# Patient Record
Sex: Male | Born: 1955 | Race: White | Hispanic: No | Marital: Married | Smoking: Former smoker
Health system: Southern US, Community
[De-identification: ages and names within clinical notes are randomized; demographics above are authoritative.]

## PROBLEM LIST (undated history)

## (undated) DIAGNOSIS — T7840XA Allergy, unspecified, initial encounter: Secondary | ICD-10-CM

## (undated) DIAGNOSIS — K219 Gastro-esophageal reflux disease without esophagitis: Secondary | ICD-10-CM

## (undated) DIAGNOSIS — E785 Hyperlipidemia, unspecified: Secondary | ICD-10-CM

## (undated) DIAGNOSIS — M1990 Unspecified osteoarthritis, unspecified site: Secondary | ICD-10-CM

## (undated) DIAGNOSIS — J45909 Unspecified asthma, uncomplicated: Secondary | ICD-10-CM

## (undated) HISTORY — DX: Allergy, unspecified, initial encounter: T78.40XA

## (undated) HISTORY — DX: Unspecified asthma, uncomplicated: J45.909

## (undated) HISTORY — DX: Unspecified osteoarthritis, unspecified site: M19.90

## (undated) HISTORY — DX: Hyperlipidemia, unspecified: E78.5

## (undated) HISTORY — DX: Gastro-esophageal reflux disease without esophagitis: K21.9

## (undated) HISTORY — PX: KNEE SURGERY: SHX244

---

## 2018-02-21 LAB — COLOGUARD: Cologuard: NEGATIVE

## 2018-04-18 ENCOUNTER — Ambulatory Visit (INDEPENDENT_AMBULATORY_CARE_PROVIDER_SITE_OTHER): Payer: BLUE CROSS/BLUE SHIELD

## 2018-04-18 ENCOUNTER — Encounter: Payer: Self-pay | Admitting: Family Medicine

## 2018-04-18 ENCOUNTER — Ambulatory Visit (INDEPENDENT_AMBULATORY_CARE_PROVIDER_SITE_OTHER): Payer: BLUE CROSS/BLUE SHIELD | Admitting: Family Medicine

## 2018-04-18 VITALS — BP 118/78 | HR 60 | Resp 17 | Ht 70.0 in | Wt 209.7 lb

## 2018-04-18 DIAGNOSIS — M1711 Unilateral primary osteoarthritis, right knee: Secondary | ICD-10-CM

## 2018-04-18 DIAGNOSIS — M25561 Pain in right knee: Secondary | ICD-10-CM | POA: Diagnosis not present

## 2018-04-18 DIAGNOSIS — Z7689 Persons encountering health services in other specified circumstances: Secondary | ICD-10-CM

## 2018-04-18 NOTE — Progress Notes (Signed)
Edwin Murray, is a 63 y.o. male  GUR:[id number]  EGB:[id number]  DOB - 11/19/1955  CC:  Chief Complaint  Patient presents with  . Establish Care  . Knee Pain       HPI: Edwin Murray is a 63 y.o. male is here today to establish care and evaluation of chronic right knee pain.  Knee Pain: Patient presents with knee pain involving the  right knee. Onset of the symptoms was 1.5 years. Right knee pain started after an injury at age 56 in which he suffered a cartilage tear which resulted in knee surgery. In 2013, he developed pain of right knee again and was told at that time he had mild osteoarthritis and imaging was significant for mild bone-on-bone changes.  He complains currently of 1/2 years of ability to extend his knee without experiencing a sharp pain.  Given level of pain at this point he is unable to completely extend his right knee/leg. This is causing him currently to walk with a limp and is also noticeable to others. Also has a problem with flexion of the right knee which causes a dull throbbing type ache.  This problem has been ongoing for a year and a half however has not worsened or changed within that timeframe. He has attempted relief as needed with ibuprofen. When he is at rest he experiences no pain. He has intermittent swelling of the knee. He denies any erythema or increased warmth of the knee. He recently relocated to [facility] from [place] therefore that has not seen any providers in our area although notes that he has all of his previous records at home.  He denies any recent falls or injuries.  Current medications: Current Outpatient Medications:  .  aspirin 81 MG chewable tablet, Chew 81 mg by mouth daily., Disp: , Rfl:  .  omeprazole (PRILOSEC OTC) 20 MG tablet, Take 20 mg by mouth daily., Disp: , Rfl:  .  rosuvastatin (CRESTOR) 10 MG tablet, Take 10 mg by mouth daily., Disp: , Rfl:    Pertinent family medical history: family history is not on file.    Social  History   Socioeconomic History  . Marital status: Married    Spouse name: Not on file  . Number of children: Not on file  . Years of education: Not on file  . Highest education level: Not on file  Occupational History  . Not on file  Social Needs  . Financial resource strain: Not on file  . Food insecurity:    Worry: Not on file    Inability: Not on file  . Transportation needs:    Medical: Not on file    Non-medical: Not on file  Tobacco Use  . Smoking status: Former Games developer  . Smokeless tobacco: Never Used  Substance and Sexual Activity  . Alcohol use: Not Currently  . Drug use: Never  . Sexual activity: Yes    Partners: Female    Birth control/protection: Post-menopausal  Lifestyle  . Physical activity:    Days per week: Not on file    Minutes per session: Not on file  . Stress: Not on file  Relationships  . Social connections:    Talks on phone: Not on file    Gets together: Not on file    Attends religious service: Not on file    Active member of club or organization: Not on file    Attends meetings of clubs or organizations: Not on file    Relationship status: Not on  file  . Intimate partner violence:    Fear of current or ex partner: Not on file    Emotionally abused: Not on file    Physically abused: Not on file    Forced sexual activity: Not on file  Other Topics Concern  . Not on file  Social History Narrative  . Not on file    Review of Systems: Pertinent negatives listed in HPI  Objective:   Vitals:   04/18/18 0918  BP: 118/78  Pulse: 60  Resp: 17  SpO2: 96%    BP Readings from Last 3 Encounters:  04/18/18 118/78    Filed Weights   04/18/18 0918  Weight: 209 lb 10.5 oz (95.1 kg)      Physical Exam  Constitutional: He is oriented to person, place, and time.  HENT:  Head: Normocephalic and atraumatic.  Neck: Normal range of motion. Neck supple.  Cardiovascular: Normal rate, regular rhythm and normal heart sounds.   Pulmonary/Chest: Effort normal and breath sounds normal.  Musculoskeletal:     Right knee: He exhibits decreased range of motion and swelling. He exhibits no deformity and no erythema.  Neurological: He is alert and oriented to person, place, and time.  Psychiatric: Mood, memory, affect and judgment normal.       Assessment and plan:  1. Encounter to establish care 2. Primary osteoarthritis of right knee - DG Knee Complete 4 Views Right; Future  - Ambulatory referral to Orthopedic Surgery -Continue Ibuprofen as needed.   A total of 25 minutes spent, greater than 50 % of this time was spent obtaining verbal history related to chief complaint, discussing options for treatment, imaging and coordination of care.   Return in about 6 months (around 10/19/2018) for cholesterol and fasting labs .   The patient was given clear instructions to go to ER or return to medical center if symptoms don't improve, worsen or new problems develop. The patient verbalized understanding. The patient was advised  to call and obtain lab results if they haven't heard anything from out office within 7-10 business days.  Edwin Courts, FNP Primary Care at [facility] [address], [place] [address] [phone]: [phone]    This note has been created with Dragon speech recognition software and Paediatric nurse. Any transcriptional errors are unintentional.

## 2018-04-18 NOTE — Patient Instructions (Addendum)
Thank you for choosing Primary Care at [facility] to be your medical home!    Edwin Murray was seen by Joaquin Courts, FNP today.   Edwin Murray's primary care provider is Bing Neighbors, FNP.   For the best care possible, you should try to see Joaquin Courts, FNP-C whenever you come to the clinic.   We look forward to seeing you again soon!  If you have any questions about your visit today, please call us at [phone] or feel free to reach your primary care provider via MyChart.       Arthritis Arthritis means joint pain. It can also mean joint disease. A joint is a place where bones come together. People who have arthritis may have:  Red joints.  Swollen joints.  Stiff joints.      Warm joints.  A fever.  A feeling of being sick. Follow these instructions at home: Pay attention to any changes in your symptoms. Take these actions to help with your pain and swelling. Medicines  Take over-the-counter and prescription medicines only as told by your doctor.  Do not take aspirin for pain if your doctor says that you may have gout. Activity  Rest your joint if your doctor tells you to.  Avoid activities that make the pain worse.  Exercise your joint regularly as told by your doctor. Try doing exercises like: ? Swimming. ? Water aerobics. ? Biking. ? Walking. Joint Care   If your joint is swollen, keep it raised (elevated) if told by your doctor.  If your joint feels stiff in the morning, try taking a warm shower.  If you have diabetes, do not apply heat without asking your doctor.  If told, apply heat to the joint: ? Put a towel between the joint and the hot pack or heating pad. ? Leave the heat on the area for 20-30 minutes.  If told, apply ice to the joint: ? Put ice in a plastic bag. ? Place a towel between your skin and the bag. ? Leave the ice on for 20 minutes, 2-3 times per day.  Keep all follow-up visits as told by your  doctor. Contact a doctor if:  The pain gets worse.  You have a fever. Get help right away if:  You have very bad pain in your joint.  You have swelling in your joint.  Your joint is red.  Many joints become painful and swollen.  You have very bad back pain.  Your leg is very weak.  You cannot control your pee (urine) or poop (stool). This information is not intended to replace advice given to you by your health care provider. Make sure you discuss any questions you have with your health care provider. Document Released: 04/29/2009 Document Revised: 07/11/2015 Document Reviewed: 04/30/2014 Elsevier Interactive Patient Education  2019 [employer].

## 2018-04-19 ENCOUNTER — Telehealth: Payer: Self-pay | Admitting: Family Medicine

## 2018-04-19 NOTE — Telephone Encounter (Signed)
Patient notified of xray results & recommendations. Expressed understanding. 

## 2018-04-19 NOTE — Telephone Encounter (Signed)
Notify patient recent knee imagine confirms mild to moderate arthritis with small amount of fluid present on the knee. He has been referred to [place] [facility] and will be contacted by their office to schedule an appointment

## 2018-04-29 ENCOUNTER — Telehealth (INDEPENDENT_AMBULATORY_CARE_PROVIDER_SITE_OTHER): Payer: Self-pay | Admitting: Orthopaedic Surgery

## 2018-04-29 ENCOUNTER — Encounter (INDEPENDENT_AMBULATORY_CARE_PROVIDER_SITE_OTHER): Payer: Self-pay | Admitting: Orthopaedic Surgery

## 2018-04-29 ENCOUNTER — Other Ambulatory Visit: Payer: Self-pay

## 2018-04-29 ENCOUNTER — Ambulatory Visit (INDEPENDENT_AMBULATORY_CARE_PROVIDER_SITE_OTHER): Payer: BLUE CROSS/BLUE SHIELD | Admitting: Orthopaedic Surgery

## 2018-04-29 VITALS — BP 132/83 | HR 59 | Ht 70.0 in | Wt 200.0 lb

## 2018-04-29 DIAGNOSIS — M1711 Unilateral primary osteoarthritis, right knee: Secondary | ICD-10-CM

## 2018-04-29 MED ORDER — METHYLPREDNISOLONE ACETATE 40 MG/ML IJ SUSP
80.0000 mg | INTRAMUSCULAR | Status: AC | PRN
  Administered 2018-04-29: 80 mg via INTRA_ARTICULAR

## 2018-04-29 MED ORDER — BUPIVACAINE HCL 0.5 % IJ SOLN
2.0000 mL | INTRAMUSCULAR | Status: AC | PRN
  Administered 2018-04-29: 2 mL via INTRA_ARTICULAR

## 2018-04-29 MED ORDER — LIDOCAINE HCL 1 % IJ SOLN
2.0000 mL | INTRAMUSCULAR | Status: AC | PRN
  Administered 2018-04-29: 2 mL

## 2018-04-29 NOTE — Progress Notes (Signed)
Office Visit Note   Patient: Edwin Murray           Date of Birth: November 19, 1955           MRN: [id number] Visit Date: 04/29/2018              Requested by: Bing Neighbors, FNP [address] [place], [place] [address] PCP: Bing Neighbors, FNP   Assessment & Plan: Visit Diagnoses:  1. Unilateral primary osteoarthritis, right knee     Plan: New arthritis right knee.  Long discussion over 45 minutes regarding the diagnosis and treatment options.  We will try her cortisone injection and pre-CERT Visco supplementation.  Have discussed exercises and anti-inflammatory medicines.  Follow-Up Instructions: Return if symptoms worsen or fail to improve.   Orders:  Orders Placed This Encounter  Procedures  . Large Joint Inj: R knee   No orders of the defined types were placed in this encounter.     Procedures: Large Joint Inj: R knee on 04/29/2018 10:03 AM Indications: pain and diagnostic evaluation Details: 25 G 1.5 in needle, anteromedial approach  Arthrogram: No  Medications: 2 mL lidocaine 1 %; 2 mL bupivacaine 0.5 %; 80 mg methylPREDNISolone acetate 40 MG/ML Procedure, treatment alternatives, risks and benefits explained, specific risks discussed. Consent was given by the patient. Immediately prior to procedure a time out was called to verify the correct patient, procedure, equipment, support staff and site/side marked as required. Patient was prepped and draped in the usual sterile fashion.       Clinical Data: No additional findings.   Subjective: Chief Complaint  Patient presents with  . Right Knee - Pain  Patient presents today for his right knee. He said that he cannot fully extend or flex his knee without pain for the past year. He had arthroscopic surgery in 2013 and was told he was bone on bone then. He walks with a limp. He does not take anything for pain. He saw his PCP and had x-rays taken on 04/18/2018 Edwin Murray is 63 years old accompanied  by his wife.  He has a long history of problems referable to his right knee dating back to when he was 63 years old.  He injured his knee and underwent an open medial meniscectomy.  He was on crutches and hospitalized about 6 days.  He was told that over time he might develop arthritis.  2013 he had recurrence of the right knee pain.  While living in [place] he had an MRI scan demonstrating some degenerative changes.  He underwent a knee arthroscopy for "cleanout".  He was told that he had a fair amount of arthritis.  Over the past 5 to 6 years she has had increasing pain to the point where he has had some popping clicking effusions.  Wear a pullover knee support on occasion.  He is found that his knee will not completely extend.  He is unsure the diagnosis and the issue with his knee.  He has had recent films that I reviewed on the PACS system.  There is considerable degenerative change in the medial compartment.  Alignment looks fine but there is irregularity of the joint surfaces and peripheral osteophytes there is subchondral sclerosis and small subchondral cyst more medial than lateral.  Might even have some small loose bodies or even posteriorly.  There are patellofemoral as well.  Films are consistent with osteoarthritis predominately in the medial compartment  HPI  Review of Systems   Objective:  Vital Signs: BP 132/83   Pulse (!) 59   Ht 5\' 10"  (1.778 m)   Wt 200 lb (90.7 kg)   BMI 28.70 kg/m   Physical Exam Constitutional:      Appearance: He is well-developed.  Eyes:     Pupils: Pupils are equal, round, and reactive to light.  Pulmonary:     Effort: Pulmonary effort is normal.  Skin:    General: Skin is warm and dry.  Neurological:     Mental Status: He is alert and oriented to person, place, and time.  Psychiatric:        Behavior: Behavior normal.     Ortho Exam wake alert and oriented x3.  Comfortable sitting.  Possibly very small effusion of his right knee.  He  lacked about 12 degrees to full extension and flexed about 110 degrees.  No instability.  Well-healed medial joint incision from a prior surgery.  Has some patellar crepitation with popping and clicking.  Mild medial joint pain.  No pain laterally.  No calf pain.  No popliteal mass.  No swelling distally straight leg raise negative.  Painless range of motion of the hip.  Specialty Comments:  No specialty comments available.  Imaging: No results found.   PMFS History: Patient Active Problem List   Diagnosis Date Noted  . Unilateral primary osteoarthritis, right knee 04/29/2018   Past Medical History:  Diagnosis Date  . GERD (gastroesophageal reflux disease)   . Hyperlipidemia     Family History  Problem Relation Age of Onset  . Meniere's disease Sister   . High Cholesterol Sister   . High Cholesterol Sister     Past Surgical History:  Procedure Laterality Date  . KNEE SURGERY Left    x2  . KNEE SURGERY Right    x2   Social History   Occupational History  . Not on file  Tobacco Use  . Smoking status: Former Games developermoker  . Smokeless tobacco: Never Used  Substance and Sexual Activity  . Alcohol use: Not Currently  . Drug use: Never  . Sexual activity: Yes    Partners: Female    Birth control/protection: Post-menopausal

## 2018-04-29 NOTE — Telephone Encounter (Signed)
Please get authorization for right knee visco injection. This is Dr.Whitfield's patient. Thanks!

## 2018-04-29 NOTE — Telephone Encounter (Signed)
[employer], needs to submit for Synvisc series right knee.

## 2018-05-04 ENCOUNTER — Telehealth (INDEPENDENT_AMBULATORY_CARE_PROVIDER_SITE_OTHER): Payer: Self-pay

## 2018-05-04 NOTE — Telephone Encounter (Signed)
Noted  

## 2018-05-04 NOTE — Telephone Encounter (Signed)
Submitted VOB for Synvisc series, right knee. 

## 2018-05-06 ENCOUNTER — Telehealth (INDEPENDENT_AMBULATORY_CARE_PROVIDER_SITE_OTHER): Payer: Self-pay

## 2018-05-06 NOTE — Telephone Encounter (Signed)
Patient's insurance, [facility] does not cover gel injections.  Please advise on what the next step would be.  Thank You.

## 2018-05-06 NOTE — Telephone Encounter (Signed)
Please advise 

## 2018-05-06 NOTE — Telephone Encounter (Signed)
If no relief with the cortisone and anti inflammatory meds then he needs to consider a knee replacement

## 2018-05-06 NOTE — Telephone Encounter (Signed)
Called and spoke with patient. I made him aware that insurance would not cover visco. He said that the cortisone injection has helped and may continue to get those in the future if needed.

## 2018-08-04 ENCOUNTER — Telehealth: Payer: Self-pay | Admitting: Family Medicine

## 2018-08-04 MED ORDER — ROSUVASTATIN CALCIUM 10 MG PO TABS: 10.0000 mg | ORAL_TABLET | Freq: Every day | ORAL | 0 refills | Status: DC

## 2018-08-04 NOTE — Telephone Encounter (Signed)
Rx sent 

## 2018-08-04 NOTE — Telephone Encounter (Signed)
Caller Name: Ashan Cueva   Reason for Call: medication refill for rosuvastatin (CRESTOR) 10 MG tablet [[id number]]   Patient stated for insurance purposes he needs a 90 day supply.    If this is a medication request: confirm pharmacy [id number], [place] call back number: [phone]    Action taken by recipient of request:

## 2018-08-11 ENCOUNTER — Other Ambulatory Visit: Payer: Self-pay | Admitting: Family Medicine

## 2018-10-18 ENCOUNTER — Telehealth: Payer: Self-pay

## 2018-10-18 NOTE — Telephone Encounter (Signed)
Called patient to do their pre-visit COVID screening.  Call went to voicemail. Unable to do prescreening.  

## 2018-10-18 NOTE — Telephone Encounter (Signed)
Patient returned call for previsit screening questions.  Have you tested positive for COVID or are you currently waiting for COVID test results? no  Have you recently traveled internationally([place], [place], [place], [place], [place]) or within the [place] to a hotspot area([place], [place], [place], [place], [place])? no  Are you currently experiencing any of the following symptoms: fever, cough, SHOB, fatigue, body aches, loss of smell/taste, rash, diarrhea, vomiting, severe headaches, weakness, sore throat? no  Have you been in contact with anyone who has recently travelled? no  Have you been in contact with anyone who is experiencing any of the above symptoms or been diagnosed with COVID  or works in or has recently visited a SNF? no

## 2018-10-19 ENCOUNTER — Other Ambulatory Visit: Payer: Self-pay

## 2018-10-19 ENCOUNTER — Ambulatory Visit (INDEPENDENT_AMBULATORY_CARE_PROVIDER_SITE_OTHER): Payer: BC Managed Care – PPO | Admitting: Nurse Practitioner

## 2018-10-19 VITALS — BP 131/86 | HR 54 | Temp 97.3°F | Resp 17 | Wt 204.4 lb

## 2018-10-19 DIAGNOSIS — Z1159 Encounter for screening for other viral diseases: Secondary | ICD-10-CM

## 2018-10-19 DIAGNOSIS — Z114 Encounter for screening for human immunodeficiency virus [HIV]: Secondary | ICD-10-CM | POA: Diagnosis not present

## 2018-10-19 DIAGNOSIS — Z833 Family history of diabetes mellitus: Secondary | ICD-10-CM

## 2018-10-19 DIAGNOSIS — Z1211 Encounter for screening for malignant neoplasm of colon: Secondary | ICD-10-CM

## 2018-10-19 DIAGNOSIS — Z131 Encounter for screening for diabetes mellitus: Secondary | ICD-10-CM

## 2018-10-19 DIAGNOSIS — E785 Hyperlipidemia, unspecified: Secondary | ICD-10-CM | POA: Diagnosis not present

## 2018-10-19 DIAGNOSIS — L2082 Flexural eczema: Secondary | ICD-10-CM

## 2018-10-19 DIAGNOSIS — R319 Hematuria, unspecified: Secondary | ICD-10-CM

## 2018-10-19 DIAGNOSIS — Z130 Encounter for screening for diseases of the blood and blood-forming organs and certain disorders involving the immune mechanism: Secondary | ICD-10-CM

## 2018-10-19 DIAGNOSIS — Z125 Encounter for screening for malignant neoplasm of prostate: Secondary | ICD-10-CM

## 2018-10-19 MED ORDER — CLOBETASOL PROPIONATE 0.05 % EX CREA: 1.0000 "application " | TOPICAL_CREAM | CUTANEOUS | 0 refills | Status: DC | PRN

## 2018-10-19 MED ORDER — CLOBETASOL PROPIONATE 0.05 % EX CREA: 1.0000 "application " | TOPICAL_CREAM | CUTANEOUS | 1 refills | Status: AC | PRN

## 2018-10-19 MED ORDER — ROSUVASTATIN CALCIUM 10 MG PO TABS: 10.0000 mg | ORAL_TABLET | Freq: Every day | ORAL | 1 refills | Status: DC

## 2018-10-19 NOTE — Progress Notes (Signed)
Assessment & Plan:  Edwin Murray was seen today for hyperlipidemia and gastroesophageal reflux.  Diagnoses and all orders for this visit:  Hyperlipidemia, unspecified hyperlipidemia type -     Lipid Panel -     Comprehensive metabolic panel -     rosuvastatin (CRESTOR) 10 MG tablet; Take 1 tablet (10 mg total) by mouth daily.  Need for hepatitis C screening test -     Hepatitis C Antibody  Screening for HIV (human immunodeficiency virus) -     HIV antibody (with reflex)  Prostate cancer screening -     PSA  Diabetes mellitus screening -     Hemoglobin A1c  Screening for deficiency anemia -     CBC  Colon cancer screening -     Fecal occult blood, imunochemical; Future  Hematuria, unspecified type -     Urinalysis, Routine w reflex microscopic  Family history of diabetes mellitus in mother -     Hemoglobin A1c  Flexural eczema -     clobetasol cream (TEMOVATE) 0.05 %; Apply 1 application topically as needed (eczema).  Other orders -     Discontinue: clobetasol cream (TEMOVATE) 0.05 %; Apply 1 application topically as needed (eczema).    Patient has been counseled on age-appropriate routine health concerns for screening and prevention. These are reviewed and up-to-date. Referrals have been placed accordingly. Immunizations are up-to-date or declined.    Subjective:   Chief Complaint  Patient presents with  . Hyperlipidemia  . Gastroesophageal Reflux   HPI Edwin Murray 63 y.o. male presents to office today for follow up.  He has chronic right knee pain. Saw Dr. Cleophas Dunker in March and had cortisone injection. States knee feels much better and he has been pleased with the results. Patient presents with knee pain involving the  right knee.  Right knee pain started after an injury at age 34 in which he suffered a cartilage tear which resulted in knee surgery. In 2013, he developed pain of right knee again and was told at that time he had mild osteoarthritis and  imaging was significant for mild bone-on-bone changes.  He complains currently of 1/2 years of ability to extend his knee without experiencing a sharp pain.  When he is at rest he experiences no pain. He has intermittent swelling of the knee. He denies any erythema or increased warmth of the knee.   Hyperlipidemia Patient presents for follow up to hyperlipidemia.  He is medication compliant taking crestor 10 mg daily as prescribed.  He is diet compliant and deniesstatin intolerance including myalgias. He used to be a runner now he walks at least 2 miles every day with his spouse.   Review of Systems  Constitutional: Negative for fever, malaise/fatigue and weight loss.  HENT: Negative.  Negative for nosebleeds.   Eyes: Negative.  Negative for blurred vision, double vision and photophobia.  Respiratory: Negative.  Negative for cough and shortness of breath.   Cardiovascular: Negative.  Negative for chest pain, palpitations and leg swelling.  Gastrointestinal: Negative.  Negative for heartburn, nausea and vomiting.  Musculoskeletal: Negative.  Negative for myalgias.  Skin: Positive for itching and rash.  Neurological: Negative.  Negative for dizziness, focal weakness, seizures and headaches.  Psychiatric/Behavioral: Negative.  Negative for suicidal ideas.    Past Medical History:  Diagnosis Date  . GERD (gastroesophageal reflux disease)   . Hyperlipidemia     Past Surgical History:  Procedure Laterality Date  . KNEE SURGERY Left    x2  .  KNEE SURGERY Right    x2    Family History  Problem Relation Age of Onset  . Meniere's disease Sister   . High Cholesterol Sister   . High Cholesterol Sister     Social History Reviewed with no changes to be made today.   Outpatient Medications Prior to Visit  Medication Sig Dispense Refill  . albuterol (VENTOLIN HFA) 108 (90 Base) MCG/ACT inhaler Inhale 1-2 puffs into the lungs every 6 (six) hours as needed.    Marland Kitchen aspirin 81 MG chewable  tablet Chew 81 mg by mouth daily.    Marland Kitchen omeprazole (PRILOSEC OTC) 20 MG tablet Take 20 mg by mouth daily.    . clobetasol cream (TEMOVATE) 6.28 % Apply 1 application topically as needed (eczema).    . rosuvastatin (CRESTOR) 10 MG tablet TAKE 1 TABLET BY MOUTH EVERY DAY 90 tablet 0   No facility-administered medications prior to visit.     Allergies  Allergen Reactions  . Other     Eggplant       Objective:    BP 131/86   Pulse (!) 54   Temp (!) 97.3 F (36.3 C) (Temporal)   Resp 17   Wt 204 lb 6.4 oz (92.7 kg)   SpO2 96%   BMI 29.33 kg/m  Wt Readings from Last 3 Encounters:  10/19/18 204 lb 6.4 oz (92.7 kg)  04/29/18 200 lb (90.7 kg)  04/18/18 209 lb 10.5 oz (95.1 kg)    Physical Exam Vitals signs and nursing note reviewed.  Constitutional:      Appearance: He is well-developed.  HENT:     Head: Normocephalic and atraumatic.  Neck:     Musculoskeletal: Normal range of motion.  Cardiovascular:     Rate and Rhythm: Regular rhythm. Bradycardia present.     Heart sounds: Normal heart sounds. No murmur. No friction rub. No gallop.   Pulmonary:     Effort: Pulmonary effort is normal. No tachypnea or respiratory distress.     Breath sounds: Normal breath sounds. No decreased breath sounds, wheezing, rhonchi or rales.  Chest:     Chest wall: No tenderness.  Abdominal:     General: Bowel sounds are normal.     Palpations: Abdomen is soft.  Musculoskeletal: Normal range of motion.  Skin:    General: Skin is warm and dry.     Findings: Rash present. Rash is macular.          Comments: Eczema rash on bilateral elbows  Neurological:     Mental Status: He is alert and oriented to person, place, and time.     Coordination: Coordination normal.  Psychiatric:        Behavior: Behavior normal. Behavior is cooperative.        Thought Content: Thought content normal.        Judgment: Judgment normal.          Patient has been counseled extensively about nutrition and  exercise as well as the importance of adherence with medications and regular follow-up. The patient was given clear instructions to go to ER or return to medical center if symptoms don't improve, worsen or new problems develop. The patient verbalized understanding.   Follow-up: Return if symptoms worsen or fail to improve.   Gildardo Pounds, FNP-BC [facility] [place], [place]   10/19/2018, 9:15 AM

## 2018-10-19 NOTE — Patient Instructions (Signed)

## 2018-10-20 LAB — URINALYSIS, ROUTINE W REFLEX MICROSCOPIC
Bilirubin, UA: NEGATIVE
Glucose, UA: NEGATIVE
Ketones, UA: NEGATIVE
Leukocytes,UA: NEGATIVE
Nitrite, UA: NEGATIVE
Protein,UA: NEGATIVE
Specific Gravity, UA: 1.017 (ref 1.005–1.030)
Urobilinogen, Ur: 0.2 mg/dL (ref 0.2–1.0)
pH, UA: 5 (ref 5.0–7.5)

## 2018-10-20 LAB — COMPREHENSIVE METABOLIC PANEL
ALT: 28 IU/L (ref 0–44)
AST: 33 IU/L (ref 0–40)
Albumin/Globulin Ratio: 2.6 — ABNORMAL HIGH (ref 1.2–2.2)
Albumin: 4.6 g/dL (ref 3.8–4.8)
Alkaline Phosphatase: 70 IU/L (ref 39–117)
BUN/Creatinine Ratio: 17 (ref 10–24)
BUN: 19 mg/dL (ref 8–27)
Bilirubin Total: 0.4 mg/dL (ref 0.0–1.2)
CO2: 21 mmol/L (ref 20–29)
Calcium: 9.5 mg/dL (ref 8.6–10.2)
Chloride: 104 mmol/L (ref 96–106)
Creatinine, Ser: 1.14 mg/dL (ref 0.76–1.27)
GFR calc Af Amer: 80 mL/min/{1.73_m2} (ref >59)
GFR calc non Af Amer: 69 mL/min/{1.73_m2} (ref >59)
Globulin, Total: 1.8 g/dL (ref 1.5–4.5)
Glucose: 97 mg/dL (ref 65–99)
Potassium: 4.1 mmol/L (ref 3.5–5.2)
Sodium: 139 mmol/L (ref 134–144)
Total Protein: 6.4 g/dL (ref 6.0–8.5)

## 2018-10-20 LAB — CBC
Hematocrit: 41.9 % (ref 37.5–51.0)
Hemoglobin: 14.5 g/dL (ref 13.0–17.7)
MCH: 30.9 pg (ref 26.6–33.0)
MCHC: 34.6 g/dL (ref 31.5–35.7)
MCV: 89 fL (ref 79–97)
Platelets: 149 10*3/uL — ABNORMAL LOW (ref 150–450)
RBC: 4.69 x10E6/uL (ref 4.14–5.80)
RDW: 13.5 % (ref 11.6–15.4)
WBC: 4.5 10*3/uL (ref 3.4–10.8)

## 2018-10-20 LAB — LIPID PANEL
Chol/HDL Ratio: 3.3 ratio (ref 0.0–5.0)
Cholesterol, Total: 164 mg/dL (ref 100–199)
HDL: 49 mg/dL (ref >39)
LDL Chol Calc (NIH): 96 mg/dL (ref 0–99)
Triglycerides: 102 mg/dL (ref 0–149)
VLDL Cholesterol Cal: 19 mg/dL (ref 5–40)

## 2018-10-20 LAB — MICROSCOPIC EXAMINATION
Bacteria, UA: NONE SEEN
Casts: NONE SEEN /lpf
Epithelial Cells (non renal): NONE SEEN /hpf (ref 0–10)

## 2018-10-20 LAB — HIV ANTIBODY (ROUTINE TESTING W REFLEX): HIV Screen 4th Generation wRfx: NONREACTIVE

## 2018-10-20 LAB — HEPATITIS C ANTIBODY: Hep C Virus Ab: <0.1 s/co ratio (ref 0.0–0.9)

## 2018-10-20 LAB — HEMOGLOBIN A1C
Est. average glucose Bld gHb Est-mCnc: 111 mg/dL
Hgb A1c MFr Bld: 5.5 % (ref 4.8–5.6)

## 2018-10-20 LAB — PSA: Prostate Specific Ag, Serum: 0.6 ng/mL (ref 0.0–4.0)

## 2018-10-24 ENCOUNTER — Other Ambulatory Visit: Payer: Self-pay | Admitting: Nurse Practitioner

## 2018-10-24 DIAGNOSIS — R3121 Asymptomatic microscopic hematuria: Secondary | ICD-10-CM

## 2018-10-24 DIAGNOSIS — R809 Proteinuria, unspecified: Secondary | ICD-10-CM

## 2018-11-30 ENCOUNTER — Encounter (INDEPENDENT_AMBULATORY_CARE_PROVIDER_SITE_OTHER): Payer: Self-pay | Admitting: Orthopaedic Surgery

## 2018-12-01 DIAGNOSIS — M1990 Unspecified osteoarthritis, unspecified site: Secondary | ICD-10-CM | POA: Diagnosis not present

## 2018-12-01 DIAGNOSIS — R3129 Other microscopic hematuria: Secondary | ICD-10-CM | POA: Diagnosis not present

## 2018-12-01 DIAGNOSIS — E785 Hyperlipidemia, unspecified: Secondary | ICD-10-CM | POA: Diagnosis not present

## 2018-12-05 ENCOUNTER — Encounter: Payer: Self-pay | Admitting: Orthopaedic Surgery

## 2018-12-05 ENCOUNTER — Ambulatory Visit (INDEPENDENT_AMBULATORY_CARE_PROVIDER_SITE_OTHER): Payer: BC Managed Care – PPO | Admitting: Orthopaedic Surgery

## 2018-12-05 ENCOUNTER — Other Ambulatory Visit: Payer: Self-pay

## 2018-12-05 VITALS — BP 146/85 | HR 58 | Ht 69.0 in | Wt 205.0 lb

## 2018-12-05 DIAGNOSIS — M1711 Unilateral primary osteoarthritis, right knee: Secondary | ICD-10-CM

## 2018-12-05 MED ORDER — LIDOCAINE HCL 1 % IJ SOLN
2.0000 mL | INTRAMUSCULAR | Status: AC | PRN
  Administered 2018-12-05: 2 mL

## 2018-12-05 MED ORDER — METHYLPREDNISOLONE ACETATE 40 MG/ML IJ SUSP
80.0000 mg | INTRAMUSCULAR | Status: AC | PRN
  Administered 2018-12-05: 80 mg via INTRA_ARTICULAR

## 2018-12-05 MED ORDER — BUPIVACAINE HCL 0.5 % IJ SOLN
2.0000 mL | INTRAMUSCULAR | Status: AC | PRN
  Administered 2018-12-05: 11:00:00 2 mL via INTRA_ARTICULAR

## 2018-12-05 NOTE — Progress Notes (Signed)
Office Visit Note   Patient: Edwin Murray           Date of Birth: Dec 01, 1955           MRN: [id number] Visit Date: 12/05/2018              Requested by: Bing Neighbors, FNP [address] [place],  [place] [address] PCP: Bing Neighbors, FNP   Assessment & Plan: Visit Diagnoses:  1. Unilateral primary osteoarthritis, right knee     Plan: Repeat cortisone injection right knee.  Long discussion regarding viscosupplementation.  He will check with his insurance company.  Follow-Up Instructions: Return if symptoms worsen or fail to improve.   Orders:  Orders Placed This Encounter  Procedures  . Large Joint Inj: R knee   No orders of the defined types were placed in this encounter.     Procedures: Large Joint Inj: R knee on 12/05/2018 11:14 AM Indications: pain and diagnostic evaluation Details: 25 G 1.5 in needle, anteromedial approach  Arthrogram: No  Medications: 2 mL lidocaine 1 %; 2 mL bupivacaine 0.5 %; 80 mg methylPREDNISolone acetate 40 MG/ML Procedure, treatment alternatives, risks and benefits explained, specific risks discussed. Consent was given by the patient. Immediately prior to procedure a time out was called to verify the correct patient, procedure, equipment, support staff and site/side marked as required. Patient was prepped and draped in the usual sterile fashion.       Clinical Data: No additional findings.   Subjective: Chief Complaint  Patient presents with  . Right Knee - Pain  Patient presents today for recurrent right knee pain. He was last evaluated in March and received a cortisone injection. The injection was very helpful, but has been hurting for about a month again. He is wanting to get another injection today. He does not take anything for pain.  Most of the pain is localized along the medial compartment of his knee.  No numbness or tingling.  No groin pain  HPI  Review of Systems   Objective: Vital Signs: BP  (!) 146/85   Pulse (!) 58   Ht 5\' 9"  (1.753 m)   Wt 205 lb (93 kg)   BMI 30.27 kg/m   Physical Exam Constitutional:      Appearance: He is well-developed.  Eyes:     Pupils: Pupils are equal, round, and reactive to light.  Pulmonary:     Effort: Pulmonary effort is normal.  Skin:    General: Skin is warm and dry.  Neurological:     Mental Status: He is alert and oriented to person, place, and time.  Psychiatric:        Behavior: Behavior normal.     Ortho Exam awake alert and oriented x3.  Comfortable sitting.  Prior open medial meniscectomy many years ago with scar well-healed.  Lacks a few degrees to full knee extension with and with increased varus with weightbearing.  No distal edema.  No calf pain.  Painless range of motion of hip.  Some patellar crepitation but no pain with patella compression.  Flexed over 120 degrees without instability  Specialty Comments:  No specialty comments available.  Imaging: No results found.   PMFS History: Patient Active Problem List   Diagnosis Date Noted  . Unilateral primary osteoarthritis, right knee 04/29/2018   Past Medical History:  Diagnosis Date  . GERD (gastroesophageal reflux disease)   . Hyperlipidemia     Family History  Problem Relation Age of  Onset  . Meniere's disease Sister   . High Cholesterol Sister   . High Cholesterol Sister     Past Surgical History:  Procedure Laterality Date  . KNEE SURGERY Left    x2  . KNEE SURGERY Right    x2   Social History   Occupational History  . Not on file  Tobacco Use  . Smoking status: Former Research scientist (life sciences)  . Smokeless tobacco: Never Used  Substance and Sexual Activity  . Alcohol use: Not Currently  . Drug use: Never  . Sexual activity: Yes    Partners: Female    Birth control/protection: Post-menopausal

## 2018-12-06 ENCOUNTER — Ambulatory Visit: Payer: BC Managed Care – PPO | Admitting: Orthopaedic Surgery

## 2019-04-07 ENCOUNTER — Other Ambulatory Visit: Payer: Self-pay | Admitting: Nurse Practitioner

## 2019-04-07 DIAGNOSIS — E785 Hyperlipidemia, unspecified: Secondary | ICD-10-CM

## 2019-06-15 DIAGNOSIS — R3912 Poor urinary stream: Secondary | ICD-10-CM | POA: Diagnosis not present

## 2019-06-15 DIAGNOSIS — N5201 Erectile dysfunction due to arterial insufficiency: Secondary | ICD-10-CM | POA: Diagnosis not present

## 2019-06-15 DIAGNOSIS — R3121 Asymptomatic microscopic hematuria: Secondary | ICD-10-CM | POA: Diagnosis not present

## 2019-06-22 ENCOUNTER — Other Ambulatory Visit: Payer: Self-pay

## 2019-06-22 ENCOUNTER — Encounter: Payer: Self-pay | Admitting: Orthopaedic Surgery

## 2019-06-22 ENCOUNTER — Ambulatory Visit (INDEPENDENT_AMBULATORY_CARE_PROVIDER_SITE_OTHER): Payer: BC Managed Care – PPO | Admitting: Orthopaedic Surgery

## 2019-06-22 VITALS — Ht 69.0 in | Wt 205.0 lb

## 2019-06-22 DIAGNOSIS — M1711 Unilateral primary osteoarthritis, right knee: Secondary | ICD-10-CM

## 2019-06-22 MED ORDER — METHYLPREDNISOLONE ACETATE 40 MG/ML IJ SUSP
80.0000 mg | INTRAMUSCULAR | Status: AC | PRN
  Administered 2019-06-22: 80 mg via INTRA_ARTICULAR

## 2019-06-22 MED ORDER — LIDOCAINE HCL 1 % IJ SOLN
2.0000 mL | INTRAMUSCULAR | Status: AC | PRN
  Administered 2019-06-22: 2 mL

## 2019-06-22 MED ORDER — BUPIVACAINE HCL 0.5 % IJ SOLN
2.0000 mL | INTRAMUSCULAR | Status: AC | PRN
  Administered 2019-06-22: 2 mL via INTRA_ARTICULAR

## 2019-06-22 NOTE — Progress Notes (Signed)
Office Visit Note   Patient: Edwin Murray           Date of Birth: 1955/10/04           MRN: [id number] Visit Date: 06/22/2019              Requested by: Edwin Murray, [place],  [place] [address] PCP: Edwin Jun, FNP   Assessment & Plan: Visit Diagnoses:  1. Unilateral primary osteoarthritis, right knee     Plan: Edwin Murray has been receiving cortisone injection to his right knee about every 6 months which alleviates his symptoms of arthritis.  He has had a recent exacerbation and would like to have another cortisone injection.  This was performed without difficulty  Follow-Up Instructions: Return if symptoms worsen or fail to improve.   Orders:  Orders Placed This Encounter  Procedures  . Large Joint Inj: R knee   No orders of the defined types were placed in this encounter.     Procedures: Large Joint Inj: R knee on 06/22/2019 2:16 PM Indications: pain and diagnostic evaluation Details: 25 G 1.5 in needle, anteromedial approach  Arthrogram: No  Medications: 2 mL lidocaine 1 %; 2 mL bupivacaine 0.5 %; 80 mg methylPREDNISolone acetate 40 MG/ML Procedure, treatment alternatives, risks and benefits explained, specific risks discussed. Consent was given by the patient. Immediately prior to procedure a time out was called to verify the correct patient, procedure, equipment, support staff and site/side marked as required. Patient was prepped and draped in the usual sterile fashion.       Clinical Data: No additional findings.   Subjective: Chief Complaint  Patient presents with  . Right Knee - Pain  Patient presents today for recurrent right knee pain. He received a cortisone injection in October of 2020 and states that it just started hurting again. He would like to get another injection today. He is not diabetic. He is not taking anything for pain. No grinding,, giving way, or catching. He does state that it swells. Had surgery  on his knee years ago with an open meniscectomy and has had some loss of full extension since that time.  Also lacks some flexion compared to his left knee but that has not changed recently most of his pain is localized along the medial compartment.  No calf pain  HPI  Review of Systems  Constitutional: Negative for fatigue.  HENT: Negative for ear pain.   Eyes: Negative for pain.  Respiratory: Negative for shortness of breath.   Cardiovascular: Negative for leg swelling.  Gastrointestinal: Negative for constipation and diarrhea.  Endocrine: Negative for cold intolerance and heat intolerance.  Genitourinary: Negative for difficulty urinating.  Musculoskeletal: Positive for joint swelling.  Skin: Negative for rash.  Allergic/Immunologic: Positive for food allergies.  Neurological: Negative for weakness.  Hematological: Does not bruise/bleed easily.  Psychiatric/Behavioral: Negative for sleep disturbance.     Objective: Vital Signs: Ht 5\' 9"  (1.753 m)   Wt 205 lb (93 kg)   BMI 30.27 kg/m   Physical Exam Constitutional:      Appearance: He is well-developed.  Eyes:     Pupils: Pupils are equal, round, and reactive to light.  Pulmonary:     Effort: Pulmonary effort is normal.  Skin:    General: Skin is warm and dry.  Neurological:     Mental Status: He is alert and oriented to person, place, and time.  Psychiatric:  Behavior: Behavior normal.     Ortho Exam right knee with small effusion.  Mostly medial joint pain.  I thought he lacked about 10 to 15 degrees of full knee extension and flexed about 100 degrees.  No instability.  Had mild to moderate medial joint pain.  No laterally.  No calf pain.  A little popliteal fullness he might have a small popliteal cyst  Specialty Comments:  No specialty comments available.  Imaging: No results found.   PMFS History: Patient Active Problem List   Diagnosis Date Noted  . Unilateral primary osteoarthritis, right knee  04/29/2018   Past Medical History:  Diagnosis Date  . GERD (gastroesophageal reflux disease)   . Hyperlipidemia     Family History  Problem Relation Age of Onset  . Meniere's disease Sister   . High Cholesterol Sister   . High Cholesterol Sister     Past Surgical History:  Procedure Laterality Date  . KNEE SURGERY Left    x2  . KNEE SURGERY Right    x2   Social History   Occupational History  . Not on file  Tobacco Use  . Smoking status: Former Games developer  . Smokeless tobacco: Never Used  Substance and Sexual Activity  . Alcohol use: Not Currently  . Drug use: Never  . Sexual activity: Yes    Partners: Female    Birth control/protection: Post-menopausal

## 2019-06-26 DIAGNOSIS — R3121 Asymptomatic microscopic hematuria: Secondary | ICD-10-CM | POA: Diagnosis not present

## 2019-07-06 DIAGNOSIS — R3121 Asymptomatic microscopic hematuria: Secondary | ICD-10-CM | POA: Diagnosis not present

## 2019-07-06 DIAGNOSIS — R3129 Other microscopic hematuria: Secondary | ICD-10-CM | POA: Diagnosis not present

## 2019-07-10 DIAGNOSIS — N5201 Erectile dysfunction due to arterial insufficiency: Secondary | ICD-10-CM | POA: Diagnosis not present

## 2019-07-10 DIAGNOSIS — R3121 Asymptomatic microscopic hematuria: Secondary | ICD-10-CM | POA: Diagnosis not present

## 2019-07-10 DIAGNOSIS — R3912 Poor urinary stream: Secondary | ICD-10-CM | POA: Diagnosis not present

## 2019-08-03 ENCOUNTER — Telehealth (INDEPENDENT_AMBULATORY_CARE_PROVIDER_SITE_OTHER): Payer: BC Managed Care – PPO | Admitting: Internal Medicine

## 2019-08-03 DIAGNOSIS — J4520 Mild intermittent asthma, uncomplicated: Secondary | ICD-10-CM

## 2019-08-03 DIAGNOSIS — E785 Hyperlipidemia, unspecified: Secondary | ICD-10-CM | POA: Diagnosis not present

## 2019-08-03 MED ORDER — ROSUVASTATIN CALCIUM 10 MG PO TABS: 10.0000 mg | ORAL_TABLET | Freq: Every day | ORAL | 1 refills | Status: DC

## 2019-08-03 MED ORDER — ALBUTEROL SULFATE HFA 108 (90 BASE) MCG/ACT IN AERS: 1.0000 | INHALATION_SPRAY | Freq: Four times a day (QID) | RESPIRATORY_TRACT | 1 refills | Status: AC | PRN

## 2019-08-03 NOTE — Progress Notes (Signed)
Virtual Visit via Telephone Note  I connected with Edwin Murray, on 08/03/2019 at 1:59 PM by telephone due to the COVID-19 pandemic and verified that I am speaking with the correct person using two identifiers.   Consent: I discussed the limitations, risks, security and privacy concerns of performing an evaluation and management service by telephone and the availability of in person appointments. I also discussed with the patient that there may be a patient responsible charge related to this service. The patient expressed understanding and agreed to proceed.   Location of Patient: Home   Location of Provider: Clinic    Persons participating in Telemedicine visit: Edwin Murray [facility] Dr. Earlene Plater      History of Present Illness: Patient has a visit to follow up on chronic medical conditions. He requests refills for his medications.   He has to use his albuterol about 1-2x per year due to environmental triggers. Eggplant also seems to be a trigger and he avoids this. His current inhaler is expired.   HYPERLIPIDEMIA  Symptoms Chest pain on exertion:  no    Leg claudication:   No   Medication Monitoring Compliance- Yes with Crestor 10 mg   Right upper quadrant pain- no   Muscle aches- no   Past Medical History:  Diagnosis Date  . GERD (gastroesophageal reflux disease)   . Hyperlipidemia    Allergies  Allergen Reactions  . Other     Eggplant    Current Outpatient Medications on File Prior to Visit  Medication Sig Dispense Refill  . albuterol (VENTOLIN HFA) 108 (90 Base) MCG/ACT inhaler Inhale 1-2 puffs into the lungs every 6 (six) hours as needed.    Marland Kitchen aspirin 81 MG chewable tablet Chew 81 mg by mouth daily.    . clobetasol cream (TEMOVATE) 0.05 % Apply 1 application topically as needed (eczema). 30 g 1  . omeprazole (PRILOSEC OTC) 20 MG tablet Take 20 mg by mouth daily.    . rosuvastatin (CRESTOR) 10 MG tablet TAKE 1 TABLET BY MOUTH EVERY DAY 90  tablet 0   No current facility-administered medications on file prior to visit.    Observations/Objective: NAD. Speaking clearly.  Work of breathing normal.  Alert and oriented. Mood appropriate.   Assessment and Plan: 1. Hyperlipidemia, unspecified hyperlipidemia type Last LDL was 96 in Sept 2020 and he has no other significant risk factors so at goal of <100. Continue Crestor.  - rosuvastatin (CRESTOR) 10 MG tablet; Take 1 tablet (10 mg total) by mouth daily.  Dispense: 90 tablet; Refill: 1  2. Mild intermittent asthma, unspecified whether complicated - albuterol (VENTOLIN HFA) 108 (90 Base) MCG/ACT inhaler; Inhale 1-2 puffs into the lungs every 6 (six) hours as needed.  Dispense: 18 g; Refill: 1   Follow Up Instructions: Annual exam Sept 2021    I discussed the assessment and treatment plan with the patient. The patient was provided an opportunity to ask questions and all were answered. The patient agreed with the plan and demonstrated an understanding of the instructions.   The patient was advised to call back or seek an in-person evaluation if the symptoms worsen or if the condition fails to improve as anticipated.     I provided 10 minutes total of non-face-to-face time during this encounter including median intraservice time, reviewing previous notes, investigations, ordering medications, medical decision making, coordinating care and patient verbalized understanding at the end of the visit.    Marcy Siren, D.O. Primary Care at [facility]  08/03/2019,  1:59 PM

## 2019-11-16 ENCOUNTER — Ambulatory Visit: Payer: BC Managed Care – PPO | Admitting: Orthopaedic Surgery

## 2019-11-21 ENCOUNTER — Other Ambulatory Visit: Payer: Self-pay

## 2019-11-21 ENCOUNTER — Encounter: Payer: Self-pay | Admitting: Orthopaedic Surgery

## 2019-11-21 ENCOUNTER — Ambulatory Visit (INDEPENDENT_AMBULATORY_CARE_PROVIDER_SITE_OTHER): Payer: BC Managed Care – PPO | Admitting: Orthopaedic Surgery

## 2019-11-21 ENCOUNTER — Telehealth: Payer: Self-pay

## 2019-11-21 VITALS — Ht 69.0 in | Wt 200.0 lb

## 2019-11-21 DIAGNOSIS — M1711 Unilateral primary osteoarthritis, right knee: Secondary | ICD-10-CM | POA: Diagnosis not present

## 2019-11-21 MED ORDER — BUPIVACAINE HCL 0.5 % IJ SOLN
2.0000 mL | INTRAMUSCULAR | Status: AC | PRN
  Administered 2019-11-21: 2 mL via INTRA_ARTICULAR

## 2019-11-21 MED ORDER — METHYLPREDNISOLONE ACETATE 40 MG/ML IJ SUSP
80.0000 mg | INTRAMUSCULAR | Status: AC | PRN
  Administered 2019-11-21: 80 mg via INTRA_ARTICULAR

## 2019-11-21 MED ORDER — LIDOCAINE HCL 1 % IJ SOLN
2.0000 mL | INTRAMUSCULAR | Status: AC | PRN
  Administered 2019-11-21: 2 mL

## 2019-11-21 NOTE — Progress Notes (Addendum)
Office Visit Note   Patient: Edwin Murray           Date of Birth: [birth year]           MRN: [id number] Visit Date: 11/21/2019              Requested by: Arvilla Market, DO [address] [place],  [place] [address] PCP: Arvilla Market, DO   Assessment & Plan: Visit Diagnoses:  1. Unilateral primary osteoarthritis, right knee     Plan: Recurrent pain related to osteoarthritis right knee.  Long discussion regarding treatment options including use of NSAIDs, Voltaren gel and viscosupplementation.  Will inject right knee with cortisone today and pre-CERT for Visco supplementation. This patient is diagnosed with osteoarthritis of the knee(s).    Radiographs show evidence of joint space narrowing, osteophytes, subchondral sclerosis and/or subchondral cysts.  This patient has knee pain which interferes with functional and activities of daily living.    This patient has experienced inadequate response, adverse effects and/or intolerance with conservative treatments such as acetaminophen, NSAIDS, topical creams, physical therapy or regular exercise, knee bracing and/or weight loss.   This patient has experienced inadequate response or has a contraindication to intra articular steroid injections for at least 3 months.   This patient is not scheduled to have a total knee replacement within 6 months of starting treatment with viscosupplementation.  Follow-Up Instructions: Return Pre-CERT Visco supplementation.   Orders:  Orders Placed This Encounter  Procedures  . Large Joint Inj: R knee   No orders of the defined types were placed in this encounter.     Procedures: Large Joint Inj: R knee on 11/21/2019 3:02 PM Indications: pain and diagnostic evaluation Details: 25 G 1.5 in needle, anteromedial approach  Arthrogram: No  Medications: 2 mL lidocaine 1 %; 2 mL bupivacaine 0.5 %; 80 mg methylPREDNISolone acetate 40 MG/ML Procedure, treatment alternatives,  risks and benefits explained, specific risks discussed. Consent was given by the patient. Immediately prior to procedure a time out was called to verify the correct patient, procedure, equipment, support staff and site/side marked as required. Patient was prepped and draped in the usual sterile fashion.       Clinical Data: No additional findings.   Subjective: Chief Complaint  Patient presents with  . Right Knee - Follow-up  Patient presents today for recurrent right knee pain. He was here last in May of this year and received a right knee cortisone injection. He states that the last injection was helpful, but did not get as much relief from it as he did the previous injection. He states that his knee is stiff. He has limited range of motion in his knee if he has been active. He does not take anything for pain. He would like to get another cortisone injection today.   HPI  Review of Systems  Constitutional: Negative for fatigue.  HENT: Negative for ear pain.   Eyes: Negative for pain.  Respiratory: Negative for shortness of breath.   Cardiovascular: Negative for leg swelling.  Gastrointestinal: Negative for constipation and diarrhea.  Endocrine: Negative for cold intolerance and heat intolerance.  Genitourinary: Negative for difficulty urinating.  Musculoskeletal: Positive for joint swelling.  Skin: Negative for rash.  Allergic/Immunologic: Positive for food allergies.  Hematological: Does not bruise/bleed easily.  Psychiatric/Behavioral: Negative for sleep disturbance.     Objective: Vital Signs: Ht 5\' 9"  (1.753 m)   Wt 200 lb (90.7 kg)   BMI 29.53 kg/m   Physical  Exam Constitutional:      Appearance: He is well-developed.  Eyes:     Pupils: Pupils are equal, round, and reactive to light.  Pulmonary:     Effort: Pulmonary effort is normal.  Skin:    General: Skin is warm and dry.  Neurological:     Mental Status: He is alert and oriented to person, place, and  time.  Psychiatric:        Behavior: Behavior normal.     Ortho Exam right knee was not hot red warm or swollen.  Does have increased varus.  Lacks just a few degrees to full extension.  No effusion.  Predominately medial joint pain.  Some patella crepitation but no pain with patella compression.  No instability.  No popliteal pain or mass.  No calf discomfort.  Neurologically intact  Specialty Comments:  No specialty comments available.  Imaging: No results found.   PMFS History: Patient Active Problem List   Diagnosis Date Noted  . Unilateral primary osteoarthritis, right knee 04/29/2018   Past Medical History:  Diagnosis Date  . GERD (gastroesophageal reflux disease)   . Hyperlipidemia     Family History  Problem Relation Age of Onset  . Meniere's disease Sister   . High Cholesterol Sister   . High Cholesterol Sister     Past Surgical History:  Procedure Laterality Date  . KNEE SURGERY Left    x2  . KNEE SURGERY Right    x2   Social History   Occupational History  . Not on file  Tobacco Use  . Smoking status: Former Games developer  . Smokeless tobacco: Never Used  Substance and Sexual Activity  . Alcohol use: Not Currently  . Drug use: Never  . Sexual activity: Yes    Partners: Female    Birth control/protection: Post-menopausal

## 2019-11-21 NOTE — Telephone Encounter (Signed)
Please precert for right knee visco injections. This is Dr.Whitfield's patient. Thanks!  

## 2019-11-23 NOTE — Telephone Encounter (Signed)
Noted  

## 2019-11-27 NOTE — Patient Instructions (Signed)

## 2019-11-28 ENCOUNTER — Ambulatory Visit (INDEPENDENT_AMBULATORY_CARE_PROVIDER_SITE_OTHER): Payer: BC Managed Care – PPO | Admitting: Internal Medicine

## 2019-11-28 ENCOUNTER — Telehealth: Payer: Self-pay

## 2019-11-28 ENCOUNTER — Encounter: Payer: Self-pay | Admitting: Internal Medicine

## 2019-11-28 ENCOUNTER — Other Ambulatory Visit: Payer: Self-pay

## 2019-11-28 VITALS — BP 128/82 | HR 52 | Temp 97.2°F | Resp 17 | Ht 69.0 in | Wt 202.0 lb

## 2019-11-28 DIAGNOSIS — Z13220 Encounter for screening for lipoid disorders: Secondary | ICD-10-CM | POA: Diagnosis not present

## 2019-11-28 DIAGNOSIS — H938X1 Other specified disorders of right ear: Secondary | ICD-10-CM

## 2019-11-28 DIAGNOSIS — Z125 Encounter for screening for malignant neoplasm of prostate: Secondary | ICD-10-CM | POA: Diagnosis not present

## 2019-11-28 DIAGNOSIS — Z0000 Encounter for general adult medical examination without abnormal findings: Secondary | ICD-10-CM

## 2019-11-28 MED ORDER — FLUTICASONE PROPIONATE 50 MCG/ACT NA SUSP: 2.0000 | Freq: Every day | NASAL | 6 refills | Status: AC

## 2019-11-28 NOTE — Telephone Encounter (Signed)
Submitted VOB, Gelsyn-3, right knee.  Gel injection may not be covered due to patient having [employer].

## 2019-11-28 NOTE — Progress Notes (Signed)
Subjective:    Edwin Murray - 64 y.o. male MRN [id number]  Date of birth: Apr 30, 1955  HPI  Edwin Murray is here for annual exam. Lives with his wife. Daughter is a Human resources officer at [employer].   Does have a sensation of pressure/congestion in his left ear. Has some mild nasal congestion and sinus pressure. No popping of ear or hearing loss.   Has received COVID vaccination and is scheduled to receive booster vaccine tomorrow. Has received flu vaccine.    Reports he is followed by orthopedics to have steroid injections done in his right knee. History of meniscal tear with open knee surgery at age of 63 and then arthroscopy done in 2013. Steroid injections lessen pain for about 5-6 months after given.   Wakes up at night sometimes and has trouble falling back asleep with thoughts going around in his head. Has found that reading will get him back to bed. Discussed that sleep cycles shorten as people age and become more easily disturbed. Advised on sleep hygiene.        Health Maintenance:  There are no preventive care reminders to display for this patient.  -  reports that he has quit smoking. He has never used smokeless tobacco. - Review of Systems: Per HPI. - Past Medical History: Patient Active Problem List   Diagnosis Date Noted  . Unilateral primary osteoarthritis, right knee 04/29/2018   - Medications: reviewed and updated   Objective:   Physical Exam BP 128/82   Pulse (!) 52   Temp (!) 97.2 F (36.2 C) (Temporal)   Resp 17   Ht 5\' 9"  (1.753 m)   Wt 202 lb (91.6 kg)   SpO2 96%   BMI 29.83 kg/m  Physical Exam Constitutional:      Appearance: He is not diaphoretic.  HENT:     Head: Normocephalic and atraumatic.     Right Ear: Tympanic membrane normal. There is no impacted cerumen.     Left Ear: There is no impacted cerumen.     Ears:     Comments: Left TM with fluid present. No erythema.  Eyes:     Conjunctiva/sclera: Conjunctivae normal.      Pupils: Pupils are equal, round, and reactive to light.  Neck:     Thyroid: No thyromegaly.  Cardiovascular:     Rate and Rhythm: Normal rate and regular rhythm.     Heart sounds: Normal heart sounds. No murmur heard.   Pulmonary:     Effort: Pulmonary effort is normal. No respiratory distress.     Breath sounds: Normal breath sounds. No wheezing.  Abdominal:     General: Bowel sounds are normal. There is no distension.     Palpations: Abdomen is soft.     Tenderness: There is no abdominal tenderness. There is no guarding or rebound.  Musculoskeletal:        General: No deformity. Normal range of motion.     Cervical back: Normal range of motion and neck supple.  Lymphadenopathy:     Cervical: No cervical adenopathy.  Skin:    General: Skin is warm and dry.     Findings: No rash.  Neurological:     Mental Status: He is alert and oriented to person, place, and time.     Gait: Gait is intact.  Psychiatric:        Mood and Affect: Mood and affect normal.        Judgment: Judgment normal.  Assessment & Plan:   1. Annual physical exam Counseled on 150 minutes of exercise per week, healthy eating (including decreased daily intake of saturated fats, cholesterol, added sugars, sodium), STI prevention, routine healthcare maintenance. - CBC with Differential - Comprehensive metabolic panel - Lipid Panel  2. Screening PSA (prostate specific antigen) Discussed risk vs. benefit of screening for prostate cancer. Patient opted in for screening in.  - PSA  3. Pressure sensation in right ear Suspect related to nasal/sinus congestion and perhaps some blockage of eustachian tube. Have prescribed Flonase and recommended OTC anti histamine. No signs of AOM.  - fluticasone (FLONASE) 50 MCG/ACT nasal spray; Place 2 sprays into both nostrils daily.  Dispense: 16 g; Refill: 6    Marcy Siren, D.O. 11/28/2019, 10:46 AM Primary Care at [facility]

## 2019-11-29 LAB — CBC WITH DIFFERENTIAL/PLATELET
Basophils Absolute: 0 10*3/uL (ref 0.0–0.2)
Basos: 1 %
EOS (ABSOLUTE): 0.2 10*3/uL (ref 0.0–0.4)
Eos: 3 %
Hematocrit: 46.5 % (ref 37.5–51.0)
Hemoglobin: 15.6 g/dL (ref 13.0–17.7)
Immature Grans (Abs): 0 10*3/uL (ref 0.0–0.1)
Immature Granulocytes: 0 %
Lymphocytes Absolute: 0.8 10*3/uL (ref 0.7–3.1)
Lymphs: 13 %
MCH: 31.3 pg (ref 26.6–33.0)
MCHC: 33.5 g/dL (ref 31.5–35.7)
MCV: 93 fL (ref 79–97)
Monocytes Absolute: 0.5 10*3/uL (ref 0.1–0.9)
Monocytes: 8 %
Neutrophils Absolute: 4.3 10*3/uL (ref 1.4–7.0)
Neutrophils: 75 %
Platelets: 164 10*3/uL (ref 150–450)
RBC: 4.98 x10E6/uL (ref 4.14–5.80)
RDW: 13 % (ref 11.6–15.4)
WBC: 5.7 10*3/uL (ref 3.4–10.8)

## 2019-11-29 LAB — COMPREHENSIVE METABOLIC PANEL
ALT: 21 IU/L (ref 0–44)
AST: 21 IU/L (ref 0–40)
Albumin/Globulin Ratio: 2.1 (ref 1.2–2.2)
Albumin: 4.9 g/dL — ABNORMAL HIGH (ref 3.8–4.8)
Alkaline Phosphatase: 77 IU/L (ref 44–121)
BUN/Creatinine Ratio: 16 (ref 10–24)
BUN: 17 mg/dL (ref 8–27)
Bilirubin Total: 0.4 mg/dL (ref 0.0–1.2)
CO2: 20 mmol/L (ref 20–29)
Calcium: 9.4 mg/dL (ref 8.6–10.2)
Chloride: 106 mmol/L (ref 96–106)
Creatinine, Ser: 1.08 mg/dL (ref 0.76–1.27)
GFR calc Af Amer: 85 mL/min/{1.73_m2} (ref >59)
GFR calc non Af Amer: 73 mL/min/{1.73_m2} (ref >59)
Globulin, Total: 2.3 g/dL (ref 1.5–4.5)
Glucose: 98 mg/dL (ref 65–99)
Potassium: 4.3 mmol/L (ref 3.5–5.2)
Sodium: 140 mmol/L (ref 134–144)
Total Protein: 7.2 g/dL (ref 6.0–8.5)

## 2019-11-29 LAB — LIPID PANEL
Chol/HDL Ratio: 3.6 ratio (ref 0.0–5.0)
Cholesterol, Total: 205 mg/dL — ABNORMAL HIGH (ref 100–199)
HDL: 57 mg/dL (ref >39)
LDL Chol Calc (NIH): 115 mg/dL — ABNORMAL HIGH (ref 0–99)
Triglycerides: 192 mg/dL — ABNORMAL HIGH (ref 0–149)
VLDL Cholesterol Cal: 33 mg/dL (ref 5–40)

## 2019-11-29 LAB — PSA: Prostate Specific Ag, Serum: 0.7 ng/mL (ref 0.0–4.0)

## 2019-11-29 NOTE — Progress Notes (Signed)
Patient notified of results & recommendations. Expressed understanding.

## 2019-12-08 ENCOUNTER — Telehealth: Payer: Self-pay

## 2019-12-08 NOTE — Telephone Encounter (Signed)
Per patient's insurance, [address] [place], injections of the knee are not medically necessary and will no longer be covered with this insurance.  Please advise on next option for patient.  Thank you.

## 2019-12-08 NOTE — Telephone Encounter (Signed)
Holding for Lauren. ?

## 2019-12-12 NOTE — Telephone Encounter (Signed)
Choices are repeat cortisone injections or knee replacement

## 2019-12-12 NOTE — Telephone Encounter (Signed)
Called patient. He would like to know what the out of pocket cost is the gel injections. How do I obtain this information and what's the process if he does want to go this?

## 2019-12-12 NOTE — Telephone Encounter (Signed)
Please advise 

## 2019-12-13 NOTE — Telephone Encounter (Signed)
The prices for gel injection varies, depending on the brand that patient chooses.  It will be self pay and the prices start at over $2000.00.  I could be wrong, but you can check with Toniann Fail May to make sure. Let me know.  Thank you.

## 2019-12-13 NOTE — Telephone Encounter (Signed)
Please see below and advise. Patient would like to possibly pay of out pocket for gel injections. I'm sure he would rather do the least expensive. Can you provide prices and information on how to set this up for patient? Thanks!

## 2019-12-14 NOTE — Telephone Encounter (Signed)
Spoke with patient. He is aware of the cost. He wants to hopefully wait until the beginning of 2022. He is also aware that Dr.Whitfield does not charge an office fee when doing these injections.

## 2019-12-14 NOTE — Telephone Encounter (Signed)
The least expensive are Hyalgan, Supartz and Visco-3, at $375 per injection.  If patient decides to proceed let me know please?   Also, April can also give you the info to apply for [employer] Patient Assistance, for Orthovisc or Monovisc.  Not sure how much an OOP cost would be thru them but worth looking into.

## 2019-12-28 ENCOUNTER — Encounter: Payer: BC Managed Care – PPO | Admitting: Internal Medicine

## 2020-01-22 ENCOUNTER — Other Ambulatory Visit: Payer: Self-pay

## 2020-01-22 DIAGNOSIS — E785 Hyperlipidemia, unspecified: Secondary | ICD-10-CM

## 2020-01-22 MED ORDER — ROSUVASTATIN CALCIUM 10 MG PO TABS: 10.0000 mg | ORAL_TABLET | Freq: Every day | ORAL | 1 refills | Status: DC

## 2020-02-27 ENCOUNTER — Other Ambulatory Visit: Payer: Self-pay

## 2020-02-27 ENCOUNTER — Ambulatory Visit (INDEPENDENT_AMBULATORY_CARE_PROVIDER_SITE_OTHER): Payer: BC Managed Care – PPO | Admitting: Orthopaedic Surgery

## 2020-02-27 ENCOUNTER — Encounter: Payer: Self-pay | Admitting: Orthopaedic Surgery

## 2020-02-27 DIAGNOSIS — M25562 Pain in left knee: Secondary | ICD-10-CM | POA: Diagnosis not present

## 2020-02-27 DIAGNOSIS — G8929 Other chronic pain: Secondary | ICD-10-CM

## 2020-02-27 MED ORDER — BUPIVACAINE HCL 0.25 % IJ SOLN
2.0000 mL | INTRAMUSCULAR | Status: AC | PRN
  Administered 2020-02-27: 2 mL via INTRA_ARTICULAR

## 2020-02-27 NOTE — Progress Notes (Signed)
Office Visit Note   Patient: Edwin Murray           Date of Birth: Feb 28, 1955           MRN: [id number] Visit Date: 02/27/2020              Requested by: Arvilla Market, DO [address] [place],  [place] [address] PCP: Arvilla Market, DO   Assessment & Plan: Visit Diagnoses:  1. Chronic pain of left knee     Plan: Mr Mcclurg has been evaluated and treated for problems referable to his right knee in the past.  He has evidence of osteoarthritis.  He has a cortisone injection about every 6 or so months and notes that it makes a big difference.  He has been precertified for the viscosupplementation but notes that he would like to wait.  Of more importance to him presently is pain in the left knee.  He notes that in the past he has had arthroscopy elsewhere many years ago and does have recurrent effusion and some stiffness.  By exam he has some arthritis with incomplete extension.  I do not think he has a meniscal tear.  I am going to inject his knee with betamethasone and monitor response.  Have him return if not much improvement obtain x-rays that he has been using over-the-counter medicines but still having some trouble sleep  Follow-Up Instructions: Return if symptoms worsen or fail to improve.   Orders:  Orders Placed This Encounter  Procedures  . Large Joint Inj: L knee   No orders of the defined types were placed in this encounter.     Procedures: Large Joint Inj: L knee on 02/27/2020 3:23 PM Indications: pain and diagnostic evaluation Details: 25 G 1.5 in needle, anteromedial approach  Arthrogram: No  Medications: 2 mL bupivacaine 0.25 %  2 mL betamethasone injected the medial compartment left knee with the Marcaine Procedure, treatment alternatives, risks and benefits explained, specific risks discussed. Consent was given by the patient. Patient was prepped and draped in the usual sterile fashion.       Clinical Data: No additional  findings.   Subjective: Chief Complaint  Patient presents with  . Left Knee - Pain  Patient presents today for left knee pain. He said that it started to hurt about two months ago. His pain is located posteriorly. He cannot fully flex his knee. He said that left knee symptoms are very similar to his right knee that Dr.Najeh Credit has treated him for before. He has been taking 600mg  of Ibuprofen three times daily. He said that he is interested in getting a cortisone injection today because it has helped his right knee in the past.  History of left knee arthroscopy in the past.  No injury or trauma  HPI  Review of Systems   Objective: Vital Signs: Ht 5\' 9"  (1.753 m)   Wt 202 lb (91.6 kg)   BMI 29.83 kg/m   Physical Exam Constitutional:      Appearance: He is well-developed and well-nourished.  HENT:     Mouth/Throat:     Mouth: Oropharynx is clear and moist.  Eyes:     Extraocular Movements: EOM normal.     Pupils: Pupils are equal, round, and reactive to light.  Pulmonary:     Effort: Pulmonary effort is normal.  Skin:    General: Skin is warm and dry.  Neurological:     Mental Status: He is alert and oriented to  person, place, and time.  Psychiatric:        Mood and Affect: Mood and affect normal.        Behavior: Behavior normal.     Ortho Exam left knee with small effusion.  Predominant medial joint pain that is mild to moderate diffusely.  Lacked about 7 to 8 degrees to full knee extension and flexed about 110.  No instability.  No popliteal pain or mass.  No patellar crepitation.  No lateral joint pain.  Painless range of motion both hips.  Straight leg raise negative.  No calf pain  Specialty Comments:  No specialty comments available.  Imaging: No results found.   PMFS History: Patient Active Problem List   Diagnosis Date Noted  . Pain in left knee 02/27/2020  . Unilateral primary osteoarthritis, right knee 04/29/2018   Past Medical History:  Diagnosis  Date  . GERD (gastroesophageal reflux disease)   . Hyperlipidemia     Family History  Problem Relation Age of Onset  . Meniere's disease Sister   . High Cholesterol Sister   . High Cholesterol Sister     Past Surgical History:  Procedure Laterality Date  . KNEE SURGERY Left    x2  . KNEE SURGERY Right    x2   Social History   Occupational History  . Not on file  Tobacco Use  . Smoking status: Former Games developer  . Smokeless tobacco: Never Used  Substance and Sexual Activity  . Alcohol use: Not Currently  . Drug use: Never  . Sexual activity: Yes    Partners: Female    Birth control/protection: Post-menopausal

## 2020-03-15 ENCOUNTER — Encounter: Payer: Self-pay | Admitting: Orthopaedic Surgery

## 2020-03-15 NOTE — Telephone Encounter (Signed)
Sent response 

## 2020-03-21 ENCOUNTER — Other Ambulatory Visit: Payer: Self-pay | Admitting: Internal Medicine

## 2020-03-21 DIAGNOSIS — G8929 Other chronic pain: Secondary | ICD-10-CM

## 2020-03-25 ENCOUNTER — Other Ambulatory Visit: Payer: Self-pay | Admitting: Internal Medicine

## 2020-03-25 ENCOUNTER — Encounter: Payer: Self-pay | Admitting: Internal Medicine

## 2020-03-25 ENCOUNTER — Ambulatory Visit (INDEPENDENT_AMBULATORY_CARE_PROVIDER_SITE_OTHER): Payer: BC Managed Care – PPO

## 2020-03-25 ENCOUNTER — Ambulatory Visit: Payer: BC Managed Care – PPO

## 2020-03-25 ENCOUNTER — Telehealth (INDEPENDENT_AMBULATORY_CARE_PROVIDER_SITE_OTHER): Payer: BC Managed Care – PPO | Admitting: Internal Medicine

## 2020-03-25 ENCOUNTER — Other Ambulatory Visit: Payer: Self-pay

## 2020-03-25 VITALS — HR 69 | Ht 70.0 in | Wt 200.0 lb

## 2020-03-25 DIAGNOSIS — G8929 Other chronic pain: Secondary | ICD-10-CM

## 2020-03-25 DIAGNOSIS — M25562 Pain in left knee: Secondary | ICD-10-CM

## 2020-03-25 DIAGNOSIS — M25462 Effusion, left knee: Secondary | ICD-10-CM | POA: Diagnosis not present

## 2020-03-25 DIAGNOSIS — M1712 Unilateral primary osteoarthritis, left knee: Secondary | ICD-10-CM | POA: Diagnosis not present

## 2020-03-25 NOTE — Addendum Note (Signed)
Addended by: Eloise Levels on: 03/25/2020 02:05 PM   Modules accepted: Orders

## 2020-03-25 NOTE — Progress Notes (Signed)
Virtual Visit via Telephone Note  I connected with Edwin Murray, on 03/25/2020 at 11:00 AM by telephone due to the COVID-19 pandemic and verified that I am speaking with the correct person using two identifiers.   Consent: I discussed the limitations, risks, security and privacy concerns of performing an evaluation and management service by telephone and the availability of in person appointments. I also discussed with the patient that there may be a patient responsible charge related to this service. The patient expressed understanding and agreed to proceed.   Location of Patient: Home   Location of Provider: Clinic    Persons participating in Telemedicine visit: Abad Manard [facility] Dr. Earlene Plater      History of Present Illness: Patient scheduled a SDA for his chronic left knee pain. This pain has been present for about 4 months. Was seen by ortho on 1/11 and given steroid injection to the knee. Has a history of left knee arthroscopy. Reports no known injury or fall. All of the sudden got very stiff and painful. Doesn't bend much or seem to straighten completely. Thinks hips are starting to hurt because of him walking funny. He reports that he has not reached out to ortho. His orthopedic surgeon told him next step would be xray. He reached out to this office for xray which I ordered on 2/3. He had not heard from scheduling about time for x-ray. Also has heard that Dr. Lequita Halt has come highly recommended for knee replacement surgery and he would like a referral for a second opinion.    Past Medical History:  Diagnosis Date  . GERD (gastroesophageal reflux disease)   . Hyperlipidemia    Allergies  Allergen Reactions  . Other     Eggplant    Current Outpatient Medications on File Prior to Visit  Medication Sig Dispense Refill  . albuterol (VENTOLIN HFA) 108 (90 Base) MCG/ACT inhaler Inhale 1-2 puffs into the lungs every 6 (six) hours as needed. 18 g 1  . aspirin  81 MG chewable tablet Chew 81 mg by mouth daily.    . clobetasol cream (TEMOVATE) 0.05 % Apply 1 application topically as needed (eczema). 30 g 1  . fluticasone (FLONASE) 50 MCG/ACT nasal spray Place 2 sprays into both nostrils daily. 16 g 6  . omeprazole (PRILOSEC OTC) 20 MG tablet Take 20 mg by mouth daily.    . rosuvastatin (CRESTOR) 10 MG tablet Take 1 tablet (10 mg total) by mouth daily. 90 tablet 1   No current facility-administered medications on file prior to visit.    Observations/Objective: NAD. Speaking clearly.  Work of breathing normal.  Alert and oriented. Mood appropriate.   Assessment and Plan: 1. Chronic pain of left knee Will obtain xray previously ordered later today. Discussed that patient will need to follow back up with orthopedics with his result. Will plan referral for second opinion regarding evaluation for potential surgery.  - Ambulatory referral to Orthopedic Surgery   Follow Up Instructions: Xray 2/7; ortho f/u    I discussed the assessment and treatment plan with the patient. The patient was provided an opportunity to ask questions and all were answered. The patient agreed with the plan and demonstrated an understanding of the instructions.   The patient was advised to call back or seek an in-person evaluation if the symptoms worsen or if the condition fails to improve as anticipated.     I provided 9 minutes total of non-face-to-face time during this encounter including median intraservice time, reviewing  previous notes, investigations, ordering medications, medical decision making, coordinating care and patient verbalized understanding at the end of the visit.    Marcy Siren, D.O. [facility]  03/25/2020, 11:00 AM

## 2020-03-25 NOTE — Progress Notes (Signed)
L hip/knee pain x 1 month, currently only taking IBU w/o relief

## 2020-03-28 NOTE — Telephone Encounter (Signed)
See my note

## 2020-04-03 DIAGNOSIS — M1712 Unilateral primary osteoarthritis, left knee: Secondary | ICD-10-CM | POA: Diagnosis not present

## 2020-04-03 DIAGNOSIS — M1711 Unilateral primary osteoarthritis, right knee: Secondary | ICD-10-CM | POA: Diagnosis not present

## 2020-04-03 DIAGNOSIS — M170 Bilateral primary osteoarthritis of knee: Secondary | ICD-10-CM | POA: Diagnosis not present

## 2020-04-11 DIAGNOSIS — M25512 Pain in left shoulder: Secondary | ICD-10-CM | POA: Diagnosis not present

## 2020-04-11 DIAGNOSIS — M25511 Pain in right shoulder: Secondary | ICD-10-CM | POA: Diagnosis not present

## 2020-04-16 ENCOUNTER — Ambulatory Visit: Payer: BC Managed Care – PPO | Admitting: Orthopaedic Surgery

## 2020-04-16 DIAGNOSIS — M25561 Pain in right knee: Secondary | ICD-10-CM | POA: Diagnosis not present

## 2020-04-16 DIAGNOSIS — M25511 Pain in right shoulder: Secondary | ICD-10-CM | POA: Diagnosis not present

## 2020-04-16 DIAGNOSIS — M25562 Pain in left knee: Secondary | ICD-10-CM | POA: Diagnosis not present

## 2020-04-16 DIAGNOSIS — M25512 Pain in left shoulder: Secondary | ICD-10-CM | POA: Diagnosis not present

## 2020-04-18 DIAGNOSIS — M25562 Pain in left knee: Secondary | ICD-10-CM | POA: Diagnosis not present

## 2020-04-18 DIAGNOSIS — M25511 Pain in right shoulder: Secondary | ICD-10-CM | POA: Diagnosis not present

## 2020-04-18 DIAGNOSIS — M25512 Pain in left shoulder: Secondary | ICD-10-CM | POA: Diagnosis not present

## 2020-04-18 DIAGNOSIS — M25561 Pain in right knee: Secondary | ICD-10-CM | POA: Diagnosis not present

## 2020-04-22 DIAGNOSIS — M25561 Pain in right knee: Secondary | ICD-10-CM | POA: Diagnosis not present

## 2020-04-22 DIAGNOSIS — M25512 Pain in left shoulder: Secondary | ICD-10-CM | POA: Diagnosis not present

## 2020-04-22 DIAGNOSIS — M25511 Pain in right shoulder: Secondary | ICD-10-CM | POA: Diagnosis not present

## 2020-04-22 DIAGNOSIS — M25562 Pain in left knee: Secondary | ICD-10-CM | POA: Diagnosis not present

## 2020-04-25 DIAGNOSIS — M25511 Pain in right shoulder: Secondary | ICD-10-CM | POA: Diagnosis not present

## 2020-04-29 DIAGNOSIS — M25561 Pain in right knee: Secondary | ICD-10-CM | POA: Diagnosis not present

## 2020-04-29 DIAGNOSIS — M25562 Pain in left knee: Secondary | ICD-10-CM | POA: Diagnosis not present

## 2020-04-29 DIAGNOSIS — M25512 Pain in left shoulder: Secondary | ICD-10-CM | POA: Diagnosis not present

## 2020-04-29 DIAGNOSIS — M25511 Pain in right shoulder: Secondary | ICD-10-CM | POA: Diagnosis not present

## 2020-05-02 DIAGNOSIS — M25512 Pain in left shoulder: Secondary | ICD-10-CM | POA: Diagnosis not present

## 2020-05-02 DIAGNOSIS — M25511 Pain in right shoulder: Secondary | ICD-10-CM | POA: Diagnosis not present

## 2020-05-02 DIAGNOSIS — M25562 Pain in left knee: Secondary | ICD-10-CM | POA: Diagnosis not present

## 2020-05-02 DIAGNOSIS — M25561 Pain in right knee: Secondary | ICD-10-CM | POA: Diagnosis not present

## 2020-05-06 DIAGNOSIS — M25561 Pain in right knee: Secondary | ICD-10-CM | POA: Diagnosis not present

## 2020-05-06 DIAGNOSIS — M25512 Pain in left shoulder: Secondary | ICD-10-CM | POA: Diagnosis not present

## 2020-05-06 DIAGNOSIS — M25511 Pain in right shoulder: Secondary | ICD-10-CM | POA: Diagnosis not present

## 2020-05-06 DIAGNOSIS — M25562 Pain in left knee: Secondary | ICD-10-CM | POA: Diagnosis not present

## 2020-05-07 DIAGNOSIS — M25512 Pain in left shoulder: Secondary | ICD-10-CM | POA: Diagnosis not present

## 2020-05-07 DIAGNOSIS — M25511 Pain in right shoulder: Secondary | ICD-10-CM | POA: Diagnosis not present

## 2020-05-09 DIAGNOSIS — M25511 Pain in right shoulder: Secondary | ICD-10-CM | POA: Diagnosis not present

## 2020-05-09 DIAGNOSIS — M25561 Pain in right knee: Secondary | ICD-10-CM | POA: Diagnosis not present

## 2020-05-09 DIAGNOSIS — M25562 Pain in left knee: Secondary | ICD-10-CM | POA: Diagnosis not present

## 2020-05-09 DIAGNOSIS — M25512 Pain in left shoulder: Secondary | ICD-10-CM | POA: Diagnosis not present

## 2020-05-13 DIAGNOSIS — M25561 Pain in right knee: Secondary | ICD-10-CM | POA: Diagnosis not present

## 2020-05-13 DIAGNOSIS — M25511 Pain in right shoulder: Secondary | ICD-10-CM | POA: Diagnosis not present

## 2020-05-13 DIAGNOSIS — M25512 Pain in left shoulder: Secondary | ICD-10-CM | POA: Diagnosis not present

## 2020-05-13 DIAGNOSIS — M25562 Pain in left knee: Secondary | ICD-10-CM | POA: Diagnosis not present

## 2020-05-14 DIAGNOSIS — M1712 Unilateral primary osteoarthritis, left knee: Secondary | ICD-10-CM | POA: Diagnosis not present

## 2020-05-16 DIAGNOSIS — M25562 Pain in left knee: Secondary | ICD-10-CM | POA: Diagnosis not present

## 2020-05-16 DIAGNOSIS — M25561 Pain in right knee: Secondary | ICD-10-CM | POA: Diagnosis not present

## 2020-05-29 DIAGNOSIS — M25511 Pain in right shoulder: Secondary | ICD-10-CM | POA: Diagnosis not present

## 2020-05-29 DIAGNOSIS — M25512 Pain in left shoulder: Secondary | ICD-10-CM | POA: Diagnosis not present

## 2020-05-30 ENCOUNTER — Ambulatory Visit: Payer: BC Managed Care – PPO | Admitting: Internal Medicine

## 2020-06-05 DIAGNOSIS — M2550 Pain in unspecified joint: Secondary | ICD-10-CM | POA: Diagnosis not present

## 2020-06-05 DIAGNOSIS — M25512 Pain in left shoulder: Secondary | ICD-10-CM | POA: Diagnosis not present

## 2020-06-05 DIAGNOSIS — M25511 Pain in right shoulder: Secondary | ICD-10-CM | POA: Diagnosis not present

## 2020-06-17 DIAGNOSIS — R5382 Chronic fatigue, unspecified: Secondary | ICD-10-CM | POA: Diagnosis not present

## 2020-06-17 DIAGNOSIS — M2550 Pain in unspecified joint: Secondary | ICD-10-CM | POA: Diagnosis not present

## 2020-07-01 DIAGNOSIS — M2550 Pain in unspecified joint: Secondary | ICD-10-CM | POA: Diagnosis not present

## 2020-07-01 DIAGNOSIS — M0609 Rheumatoid arthritis without rheumatoid factor, multiple sites: Secondary | ICD-10-CM | POA: Diagnosis not present

## 2020-07-01 DIAGNOSIS — M069 Rheumatoid arthritis, unspecified: Secondary | ICD-10-CM | POA: Insufficient documentation

## 2020-07-01 DIAGNOSIS — R5382 Chronic fatigue, unspecified: Secondary | ICD-10-CM | POA: Diagnosis not present

## 2020-07-17 ENCOUNTER — Other Ambulatory Visit: Payer: Self-pay | Admitting: Internal Medicine

## 2020-07-17 DIAGNOSIS — E785 Hyperlipidemia, unspecified: Secondary | ICD-10-CM

## 2020-08-22 DIAGNOSIS — R5382 Chronic fatigue, unspecified: Secondary | ICD-10-CM | POA: Diagnosis not present

## 2020-08-22 DIAGNOSIS — M0609 Rheumatoid arthritis without rheumatoid factor, multiple sites: Secondary | ICD-10-CM | POA: Diagnosis not present

## 2020-08-22 DIAGNOSIS — M2550 Pain in unspecified joint: Secondary | ICD-10-CM | POA: Diagnosis not present

## 2020-09-05 DIAGNOSIS — L814 Other melanin hyperpigmentation: Secondary | ICD-10-CM | POA: Diagnosis not present

## 2020-09-05 DIAGNOSIS — L578 Other skin changes due to chronic exposure to nonionizing radiation: Secondary | ICD-10-CM | POA: Diagnosis not present

## 2020-09-05 DIAGNOSIS — L821 Other seborrheic keratosis: Secondary | ICD-10-CM | POA: Diagnosis not present

## 2020-09-05 DIAGNOSIS — D225 Melanocytic nevi of trunk: Secondary | ICD-10-CM | POA: Diagnosis not present

## 2020-11-11 DIAGNOSIS — S63653D Sprain of metacarpophalangeal joint of left middle finger, subsequent encounter: Secondary | ICD-10-CM | POA: Diagnosis not present

## 2020-11-11 DIAGNOSIS — M79645 Pain in left finger(s): Secondary | ICD-10-CM | POA: Diagnosis not present

## 2020-11-21 DIAGNOSIS — M0609 Rheumatoid arthritis without rheumatoid factor, multiple sites: Secondary | ICD-10-CM | POA: Diagnosis not present

## 2020-11-21 DIAGNOSIS — M2550 Pain in unspecified joint: Secondary | ICD-10-CM | POA: Diagnosis not present

## 2020-11-21 DIAGNOSIS — Z79899 Other long term (current) drug therapy: Secondary | ICD-10-CM | POA: Diagnosis not present

## 2020-11-21 DIAGNOSIS — R5382 Chronic fatigue, unspecified: Secondary | ICD-10-CM | POA: Diagnosis not present

## 2020-11-22 ENCOUNTER — Encounter: Payer: BC Managed Care – PPO | Admitting: Family Medicine

## 2020-12-24 ENCOUNTER — Ambulatory Visit (INDEPENDENT_AMBULATORY_CARE_PROVIDER_SITE_OTHER): Payer: BC Managed Care – PPO | Admitting: Family Medicine

## 2020-12-24 ENCOUNTER — Other Ambulatory Visit: Payer: Self-pay

## 2020-12-24 ENCOUNTER — Encounter: Payer: Self-pay | Admitting: Family Medicine

## 2020-12-24 VITALS — BP 129/85 | HR 66 | Temp 98.0°F | Resp 16 | Wt 214.4 lb

## 2020-12-24 DIAGNOSIS — Z0000 Encounter for general adult medical examination without abnormal findings: Secondary | ICD-10-CM | POA: Diagnosis not present

## 2020-12-24 MED ORDER — ROSUVASTATIN CALCIUM 10 MG PO TABS: 10.0000 mg | ORAL_TABLET | Freq: Every day | ORAL | 1 refills | Status: DC

## 2020-12-24 NOTE — Progress Notes (Signed)
Established Patient Office Visit  Subjective:  Patient ID: Edwin Murray, male    DOB: [birth year]  Age: 65 y.o. MRN: [id number]  CC:  Chief Complaint  Patient presents with   Annual Exam    HPI Edwin Murray presents for routine annual exam. Patient denies acute complaints or concerns.   Past Medical History:  Diagnosis Date   GERD (gastroesophageal reflux disease)    Hyperlipidemia     Past Surgical History:  Procedure Laterality Date   KNEE SURGERY Left    x2   KNEE SURGERY Right    x2    Family History  Problem Relation Age of Onset   Meniere's disease Sister    High Cholesterol Sister    High Cholesterol Sister     Social History   Socioeconomic History   Marital status: Married    Spouse name: Not on file   Number of children: Not on file   Years of education: Not on file   Highest education level: Not on file  Occupational History   Not on file  Tobacco Use   Smoking status: Former   Smokeless tobacco: Never  Substance and Sexual Activity   Alcohol use: Not Currently   Drug use: Never   Sexual activity: Yes    Partners: Female    Birth control/protection: Post-menopausal  Other Topics Concern   Not on file  Social History Narrative   Not on file   Social Determinants of Health   Financial Resource Strain: Not on file  Food Insecurity: Not on file  Transportation Needs: Not on file  Physical Activity: Not on file  Stress: Not on file  Social Connections: Not on file  Intimate Partner Violence: Not on file    ROS Review of Systems  All other systems reviewed and are negative.  Objective:   Today's Vitals: BP 129/85   Pulse 66   Temp 98 F (36.7 C) (Temporal)   Resp 16   Wt 214 lb 6.4 oz (97.3 kg)   SpO2 95%   BMI 30.76 kg/m   Physical Exam Vitals and nursing note reviewed.  Constitutional:      General: He is not in acute distress. HENT:     Head: Normocephalic and atraumatic.     Right Ear: Tympanic membrane,  ear canal and external ear normal.     Left Ear: Tympanic membrane, ear canal and external ear normal.     Nose: Nose normal.     Mouth/Throat:     Mouth: Mucous membranes are moist.     Pharynx: Oropharynx is clear.  Eyes:     Conjunctiva/sclera: Conjunctivae normal.     Pupils: Pupils are equal, round, and reactive to light.  Neck:     Thyroid: No thyromegaly.  Cardiovascular:     Rate and Rhythm: Normal rate and regular rhythm.     Heart sounds: Normal heart sounds. No murmur heard. Pulmonary:     Effort: Pulmonary effort is normal.     Breath sounds: Normal breath sounds.  Abdominal:     General: There is no distension.     Palpations: Abdomen is soft. There is no mass.     Tenderness: There is no abdominal tenderness.     Hernia: There is no hernia in the left inguinal area or right inguinal area.  Genitourinary:    Penis: Normal and circumcised.      Testes: Normal.  Musculoskeletal:        General: Normal range of  motion.     Cervical back: Normal range of motion and neck supple.     Right lower leg: No edema.     Left lower leg: No edema.  Skin:    General: Skin is warm and dry.  Neurological:     General: No focal deficit present.     Mental Status: He is alert and oriented to person, place, and time. Mental status is at baseline.  Psychiatric:        Mood and Affect: Mood normal.        Behavior: Behavior normal.    Assessment & Plan:   1. Annual physical exam Unremarkable exam. Routine labs ordered - PSA - Lipid Panel - CBC with Differential - CMP14+EGFR    Outpatient Encounter Medications as of 12/24/2020  Medication Sig   albuterol (VENTOLIN HFA) 108 (90 Base) MCG/ACT inhaler Inhale 1-2 puffs into the lungs every 6 (six) hours as needed.   aspirin 81 MG chewable tablet Chew 81 mg by mouth daily.   clobetasol cream (TEMOVATE) 5.73 % Apply 1 application topically as needed (eczema).   fluticasone (FLONASE) 50 MCG/ACT nasal spray Place 2 sprays into  both nostrils daily.   ibuprofen (ADVIL) 200 MG tablet Take 200 mg by mouth every 6 (six) hours as needed.   omeprazole (PRILOSEC OTC) 20 MG tablet Take 20 mg by mouth daily.   [DISCONTINUED] rosuvastatin (CRESTOR) 10 MG tablet TAKE 1 TABLET BY MOUTH EVERY DAY   rosuvastatin (CRESTOR) 10 MG tablet Take 1 tablet (10 mg total) by mouth daily.   No facility-administered encounter medications on file as of 12/24/2020.    Follow-up: No follow-ups on file.   Becky Sax, MD

## 2020-12-25 LAB — CBC WITH DIFFERENTIAL/PLATELET
Basophils Absolute: 0.1 10*3/uL (ref 0.0–0.2)
Basos: 1 %
EOS (ABSOLUTE): 0.3 10*3/uL (ref 0.0–0.4)
Eos: 3 %
Hematocrit: 44.9 % (ref 37.5–51.0)
Hemoglobin: 15.2 g/dL (ref 13.0–17.7)
Immature Grans (Abs): 0 10*3/uL (ref 0.0–0.1)
Immature Granulocytes: 0 %
Lymphocytes Absolute: 0.6 10*3/uL — ABNORMAL LOW (ref 0.7–3.1)
Lymphs: 6 %
MCH: 31.3 pg (ref 26.6–33.0)
MCHC: 33.9 g/dL (ref 31.5–35.7)
MCV: 93 fL (ref 79–97)
Monocytes Absolute: 0.7 10*3/uL (ref 0.1–0.9)
Monocytes: 7 %
Neutrophils Absolute: 8.3 10*3/uL — ABNORMAL HIGH (ref 1.4–7.0)
Neutrophils: 83 %
Platelets: 157 10*3/uL (ref 150–450)
RBC: 4.85 x10E6/uL (ref 4.14–5.80)
RDW: 14.2 % (ref 11.6–15.4)
WBC: 10 10*3/uL (ref 3.4–10.8)

## 2020-12-25 LAB — CMP14+EGFR
ALT: 26 IU/L (ref 0–44)
AST: 28 IU/L (ref 0–40)
Albumin/Globulin Ratio: 2.4 — ABNORMAL HIGH (ref 1.2–2.2)
Albumin: 4.7 g/dL (ref 3.8–4.8)
Alkaline Phosphatase: 86 IU/L (ref 44–121)
BUN/Creatinine Ratio: 13 (ref 10–24)
BUN: 13 mg/dL (ref 8–27)
Bilirubin Total: 0.4 mg/dL (ref 0.0–1.2)
CO2: 21 mmol/L (ref 20–29)
Calcium: 9.5 mg/dL (ref 8.6–10.2)
Chloride: 108 mmol/L — ABNORMAL HIGH (ref 96–106)
Creatinine, Ser: 1 mg/dL (ref 0.76–1.27)
Globulin, Total: 2 g/dL (ref 1.5–4.5)
Glucose: 95 mg/dL (ref 70–99)
Potassium: 4.2 mmol/L (ref 3.5–5.2)
Sodium: 143 mmol/L (ref 134–144)
Total Protein: 6.7 g/dL (ref 6.0–8.5)
eGFR: 85 mL/min/{1.73_m2} (ref >59)

## 2020-12-25 LAB — LIPID PANEL
Chol/HDL Ratio: 4.1 ratio (ref 0.0–5.0)
Cholesterol, Total: 220 mg/dL — ABNORMAL HIGH (ref 100–199)
HDL: 54 mg/dL (ref >39)
LDL Chol Calc (NIH): 142 mg/dL — ABNORMAL HIGH (ref 0–99)
Triglycerides: 134 mg/dL (ref 0–149)
VLDL Cholesterol Cal: 24 mg/dL (ref 5–40)

## 2020-12-25 LAB — PSA: Prostate Specific Ag, Serum: 0.6 ng/mL (ref 0.0–4.0)

## 2020-12-31 DIAGNOSIS — R5382 Chronic fatigue, unspecified: Secondary | ICD-10-CM | POA: Diagnosis not present

## 2020-12-31 DIAGNOSIS — M0609 Rheumatoid arthritis without rheumatoid factor, multiple sites: Secondary | ICD-10-CM | POA: Diagnosis not present

## 2020-12-31 DIAGNOSIS — M2550 Pain in unspecified joint: Secondary | ICD-10-CM | POA: Diagnosis not present

## 2021-01-01 ENCOUNTER — Encounter: Payer: Self-pay | Admitting: Orthopaedic Surgery

## 2021-01-01 ENCOUNTER — Other Ambulatory Visit: Payer: Self-pay

## 2021-01-01 ENCOUNTER — Ambulatory Visit: Payer: Self-pay

## 2021-01-01 ENCOUNTER — Ambulatory Visit (INDEPENDENT_AMBULATORY_CARE_PROVIDER_SITE_OTHER): Payer: BC Managed Care – PPO | Admitting: Orthopaedic Surgery

## 2021-01-01 DIAGNOSIS — G8929 Other chronic pain: Secondary | ICD-10-CM

## 2021-01-01 DIAGNOSIS — M25561 Pain in right knee: Secondary | ICD-10-CM | POA: Diagnosis not present

## 2021-01-01 MED ORDER — METHYLPREDNISOLONE ACETATE 40 MG/ML IJ SUSP
80.0000 mg | INTRAMUSCULAR | Status: AC | PRN
  Administered 2021-01-01: 80 mg via INTRA_ARTICULAR

## 2021-01-01 MED ORDER — LIDOCAINE HCL 1 % IJ SOLN
4.0000 mL | INTRAMUSCULAR | Status: AC | PRN
  Administered 2021-01-01: 4 mL

## 2021-01-01 NOTE — Progress Notes (Signed)
Office Visit Note   Patient: Edwin Murray           Date of Birth: 1955/06/12           MRN: [id number] Visit Date: 01/01/2021              Requested by: Edwin Skeans, MD [address] [place],  [place] [address] PCP: Edwin Skeans, MD   Assessment & Plan: Visit Diagnoses:  1. Chronic pain of right knee     Plan: Pleasant 65 year old gentleman with a history of bilateral knee arthritis periodically comes in for a cortisone injection.  He states he gets 6 to 12 months relief from these.  He is wondering if he could have a cortisone injection into his right knee.  He has also been diagnosed with rheumatoid arthritis.  He is currently taking methotrexate but we will be adding Humira to help decrease the number of flares he has we will go forward with an injection today may follow-up as needed  Follow-Up Instructions: No follow-ups on file.   Orders:  Orders Placed This Encounter  Procedures   XR KNEE 3 VIEW RIGHT   No orders of the defined types were placed in this encounter.     Procedures: Large Joint Inj: R knee on 01/01/2021 3:31 PM Indications: pain and diagnostic evaluation Details: 25 G 1.5 in needle, anteromedial approach  Arthrogram: No  Medications: 80 mg methylPREDNISolone acetate 40 MG/ML; 4 mL lidocaine 1 % Outcome: tolerated well, no immediate complications Procedure, treatment alternatives, risks and benefits explained, specific risks discussed. Consent was given by the patient.      Clinical Data: No additional findings.   Subjective: Chief Complaint  Patient presents with   Right Knee - Pain  Patient presents today for right knee pain. He said that it has hurt for years, but worsening. He has pain posteriorly with flexion. He also has pain behind his patella. Swells. He is not taking anything for pain. He states that he has been diagnosed with RA.    Review of Systems  All other systems reviewed and are  negative.   Objective: Vital Signs: There were no vitals taken for this visit.  Physical Exam Constitutional:      Appearance: Normal appearance.  Pulmonary:     Effort: Pulmonary effort is normal.  Skin:    General: Skin is warm and dry.  Neurological:     Mental Status: He is alert.    Ortho Exam 70 one of his right knee.  He does have clinically varus not alignment.  He has no cellulitis no effusion no redness.  He is focally tender over the medial joint line.  Good varus valgus stability. Specialty Comments:  No specialty comments available.  Imaging: No results found.   PMFS History: Patient Active Problem List   Diagnosis Date Noted   Pain in left knee 02/27/2020   Unilateral primary osteoarthritis, right knee 04/29/2018   Past Medical History:  Diagnosis Date   GERD (gastroesophageal reflux disease)    Hyperlipidemia     Family History  Problem Relation Age of Onset   Meniere's disease Sister    High Cholesterol Sister    High Cholesterol Sister     Past Surgical History:  Procedure Laterality Date   KNEE SURGERY Left    x2   KNEE SURGERY Right    x2   Social History   Occupational History   Not on file  Tobacco Use   Smoking  status: Former   Smokeless tobacco: Never  Substance and Sexual Activity   Alcohol use: Not Currently   Drug use: Never   Sexual activity: Yes    Partners: Female    Birth control/protection: Post-menopausal

## 2021-03-18 DIAGNOSIS — M2550 Pain in unspecified joint: Secondary | ICD-10-CM | POA: Diagnosis not present

## 2021-03-18 DIAGNOSIS — M0609 Rheumatoid arthritis without rheumatoid factor, multiple sites: Secondary | ICD-10-CM | POA: Diagnosis not present

## 2021-03-18 DIAGNOSIS — R5382 Chronic fatigue, unspecified: Secondary | ICD-10-CM | POA: Diagnosis not present

## 2021-06-20 ENCOUNTER — Encounter: Payer: Self-pay | Admitting: Family Medicine

## 2021-06-20 DIAGNOSIS — M0609 Rheumatoid arthritis without rheumatoid factor, multiple sites: Secondary | ICD-10-CM | POA: Diagnosis not present

## 2021-06-20 MED ORDER — ROSUVASTATIN CALCIUM 10 MG PO TABS: 10.0000 mg | ORAL_TABLET | Freq: Every day | ORAL | 0 refills | Status: DC

## 2021-09-10 DIAGNOSIS — L821 Other seborrheic keratosis: Secondary | ICD-10-CM | POA: Diagnosis not present

## 2021-09-10 DIAGNOSIS — D1801 Hemangioma of skin and subcutaneous tissue: Secondary | ICD-10-CM | POA: Diagnosis not present

## 2021-09-10 DIAGNOSIS — L814 Other melanin hyperpigmentation: Secondary | ICD-10-CM | POA: Diagnosis not present

## 2021-09-10 DIAGNOSIS — L570 Actinic keratosis: Secondary | ICD-10-CM | POA: Diagnosis not present

## 2021-09-16 DIAGNOSIS — Z6831 Body mass index (BMI) 31.0-31.9, adult: Secondary | ICD-10-CM | POA: Diagnosis not present

## 2021-09-16 DIAGNOSIS — E669 Obesity, unspecified: Secondary | ICD-10-CM | POA: Diagnosis not present

## 2021-09-16 DIAGNOSIS — M0609 Rheumatoid arthritis without rheumatoid factor, multiple sites: Secondary | ICD-10-CM | POA: Diagnosis not present

## 2021-09-16 DIAGNOSIS — M2550 Pain in unspecified joint: Secondary | ICD-10-CM | POA: Diagnosis not present

## 2021-10-06 ENCOUNTER — Other Ambulatory Visit: Payer: Self-pay | Admitting: Family Medicine

## 2021-10-06 MED ORDER — ROSUVASTATIN CALCIUM 10 MG PO TABS: 10.0000 mg | ORAL_TABLET | Freq: Every day | ORAL | 0 refills | Status: DC

## 2021-10-15 ENCOUNTER — Encounter: Payer: Self-pay | Admitting: Orthopaedic Surgery

## 2021-10-15 ENCOUNTER — Ambulatory Visit (INDEPENDENT_AMBULATORY_CARE_PROVIDER_SITE_OTHER): Payer: BC Managed Care – PPO | Admitting: Orthopaedic Surgery

## 2021-10-15 DIAGNOSIS — M1711 Unilateral primary osteoarthritis, right knee: Secondary | ICD-10-CM

## 2021-10-15 MED ORDER — LIDOCAINE HCL 1 % IJ SOLN
2.0000 mL | INTRAMUSCULAR | Status: AC | PRN
  Administered 2021-10-15: 2 mL

## 2021-10-15 MED ORDER — METHYLPREDNISOLONE ACETATE 40 MG/ML IJ SUSP
80.0000 mg | INTRAMUSCULAR | Status: AC | PRN
  Administered 2021-10-15: 80 mg via INTRA_ARTICULAR

## 2021-10-15 MED ORDER — BUPIVACAINE HCL 0.25 % IJ SOLN
2.0000 mL | INTRAMUSCULAR | Status: AC | PRN
  Administered 2021-10-15: 2 mL via INTRA_ARTICULAR

## 2021-10-15 NOTE — Progress Notes (Signed)
   Office Visit Note   Patient: Edwin Murray           Date of Birth: 11-Jan-1956           MRN: [id number] Visit Date: 10/15/2021              Requested by: Georganna Skeans, MD [address] [place],  [place] [address] PCP: Georganna Skeans, MD   Assessment & Plan: Visit Diagnoses:  1. Unilateral primary osteoarthritis, right knee     Plan: Edwin Murray is a pleasant 66 year old gentleman with a history of right knee varus arthritis.  His last injection was in November 2022.  He has had the return of some of his painful symptoms no new injury.  Wondering if he could have another injection today  Follow-Up Instructions: Return if symptoms worsen or fail to improve.   Orders:  No orders of the defined types were placed in this encounter.  No orders of the defined types were placed in this encounter.     Procedures: Large Joint Inj on 10/15/2021 3:08 PM Indications: pain and diagnostic evaluation Details: 25 G 1.5 in needle, anteromedial approach  Arthrogram: No  Medications: 80 mg methylPREDNISolone acetate 40 MG/ML; 2 mL lidocaine 1 %; 2 mL bupivacaine 0.25 % Outcome: tolerated well, no immediate complications Procedure, treatment alternatives, risks and benefits explained, specific risks discussed. Consent was given by the patient.       Clinical Data: No additional findings.   Subjective: Chief Complaint  Patient presents with   Right Knee - Pain  Patient presents today for chronic right knee pain. He said that he received a cortisone injection in November of 2022. He received good relief until a month ago. He wants to get another injection today.   HPI  Review of Systems  All other systems reviewed and are negative.    Objective: Vital Signs: There were no vitals taken for this visit.  Physical Exam Constitutional:      Appearance: Normal appearance.  Neurological:     Mental Status: He is alert.     Ortho Exam Examination of his  right knee he has no effusion no redness mild soft tissue swelling.  Compartments of the lower leg are soft and nontender. Specialty Comments:  No specialty comments available.  Imaging: No results found.   PMFS History: Patient Active Problem List   Diagnosis Date Noted   Pain in left knee 02/27/2020   Unilateral primary osteoarthritis, right knee 04/29/2018   Past Medical History:  Diagnosis Date   GERD (gastroesophageal reflux disease)    Hyperlipidemia     Family History  Problem Relation Age of Onset   Meniere's disease Sister    High Cholesterol Sister    High Cholesterol Sister     Past Surgical History:  Procedure Laterality Date   KNEE SURGERY Left    x2   KNEE SURGERY Right    x2   Social History   Occupational History   Not on file  Tobacco Use   Smoking status: Former   Smokeless tobacco: Never  Substance and Sexual Activity   Alcohol use: Not Currently   Drug use: Never   Sexual activity: Yes    Partners: Female    Birth control/protection: Post-menopausal

## 2021-12-02 ENCOUNTER — Ambulatory Visit (INDEPENDENT_AMBULATORY_CARE_PROVIDER_SITE_OTHER): Payer: BC Managed Care – PPO | Admitting: Family Medicine

## 2021-12-02 DIAGNOSIS — K529 Noninfective gastroenteritis and colitis, unspecified: Secondary | ICD-10-CM

## 2021-12-02 NOTE — Progress Notes (Unsigned)
Established Patient Office Visit  Subjective    Patient ID: Tkai Serfass, male    DOB: 01-Jun-1955  Age: 66 y.o. MRN: [id number]  CC: No chief complaint on file.   HPI Regginald Pask presents for complaint of diarrhea for about 5 days. Denies nausea or vomiting. Denies known contacts or exposures.    Outpatient Encounter Medications as of 12/02/2021  Medication Sig   albuterol (VENTOLIN HFA) 108 (90 Base) MCG/ACT inhaler Inhale 1-2 puffs into the lungs every 6 (six) hours as needed.   aspirin 81 MG chewable tablet Chew 81 mg by mouth daily.   clobetasol cream (TEMOVATE) 5.78 % Apply 1 application topically as needed (eczema).   fluticasone (FLONASE) 50 MCG/ACT nasal spray Place 2 sprays into both nostrils daily.   methotrexate (RHEUMATREX) 2.5 MG tablet 8 tablets Orally once weekly on the same day for 30 days   omeprazole (PRILOSEC OTC) 20 MG tablet Take 20 mg by mouth daily.   rosuvastatin (CRESTOR) 10 MG tablet Take 1 tablet (10 mg total) by mouth daily.   No facility-administered encounter medications on file as of 12/02/2021.    Past Medical History:  Diagnosis Date   GERD (gastroesophageal reflux disease)    Hyperlipidemia     Past Surgical History:  Procedure Laterality Date   KNEE SURGERY Left    x2   KNEE SURGERY Right    x2    Family History  Problem Relation Age of Onset   Meniere's disease Sister    High Cholesterol Sister    High Cholesterol Sister     Social History   Socioeconomic History   Marital status: Married    Spouse name: Not on file   Number of children: Not on file   Years of education: Not on file   Highest education level: Not on file  Occupational History   Not on file  Tobacco Use   Smoking status: Former   Smokeless tobacco: Never  Substance and Sexual Activity   Alcohol use: Not Currently   Drug use: Never   Sexual activity: Yes    Partners: Female    Birth control/protection: Post-menopausal  Other Topics  Concern   Not on file  Social History Narrative   Not on file   Social Determinants of Health   Financial Resource Strain: Not on file  Food Insecurity: Not on file  Transportation Needs: Not on file  Physical Activity: Not on file  Stress: Not on file  Social Connections: Not on file  Intimate Partner Violence: Not on file    Review of Systems  Gastrointestinal:  Positive for diarrhea. Negative for blood in stool, nausea and vomiting.  All other systems reviewed and are negative.       Objective    There were no vitals taken for this visit.  Physical Exam Vitals and nursing note reviewed.  Constitutional:      General: He is not in acute distress. Cardiovascular:     Rate and Rhythm: Normal rate and regular rhythm.  Pulmonary:     Effort: Pulmonary effort is normal.     Breath sounds: Normal breath sounds.  Abdominal:     Palpations: Abdomen is soft.     Tenderness: There is no abdominal tenderness.  Neurological:     General: No focal deficit present.     Mental Status: He is alert and oriented to person, place, and time.         Assessment & Plan:   1. Gastroenteritis  Slowly improving. ?rotavirus, . Adequate fluids with clear liquids and adv as tolerated. Probiotics.     Return for prn.   Tommie Raymond, MD

## 2021-12-02 NOTE — Progress Notes (Unsigned)
Patient is here with c/o having GI issues x 5 days. Patient said that he has had loose  stools,soft stools and back to loose. Patient is very concern as to what may be going on.

## 2021-12-03 ENCOUNTER — Encounter: Payer: Self-pay | Admitting: Family Medicine

## 2021-12-23 DIAGNOSIS — M0609 Rheumatoid arthritis without rheumatoid factor, multiple sites: Secondary | ICD-10-CM | POA: Diagnosis not present

## 2022-01-03 ENCOUNTER — Other Ambulatory Visit: Payer: Self-pay | Admitting: Family Medicine

## 2022-01-05 NOTE — Telephone Encounter (Signed)
Requested medications are due for refill today.  yes  Requested medications are on the active medications list.  yes  Last refill. 10/06/2021 #90 0 rf  Future visit scheduled.   yes  Notes to clinic.  Labs are expired.    Requested Prescriptions  Pending Prescriptions Disp Refills   rosuvastatin (CRESTOR) 10 MG tablet [Pharmacy Med Name: ROSUVASTATIN CALCIUM 10 MG TAB] 90 tablet 0    Sig: TAKE 1 TABLET BY MOUTH EVERY DAY     Cardiovascular:  Antilipid - Statins 2 Failed - 01/03/2022  2:34 AM      Failed - Cr in normal range and within 360 days    Creatinine, Ser  Date Value Ref Range Status  12/24/2020 1.00 0.76 - 1.27 mg/dL Final         Failed - Lipid Panel in normal range within the last 12 months    Cholesterol, Total  Date Value Ref Range Status  12/24/2020 220 (H) 100 - 199 mg/dL Final   LDL Chol Calc (NIH)  Date Value Ref Range Status  12/24/2020 142 (H) 0 - 99 mg/dL Final   HDL  Date Value Ref Range Status  12/24/2020 54 >39 mg/dL Final   Triglycerides  Date Value Ref Range Status  12/24/2020 134 0 - 149 mg/dL Final         Passed - Patient is not pregnant      Passed - Valid encounter within last 12 months    Recent Outpatient Visits           1 month ago Gastroenteritis   [facility] at [facility], MD   1 year ago Annual physical exam   Primary Care at [facility], MD   1 year ago Chronic pain of left knee   Primary Care at [facility], Kandee Keen, MD   2 years ago Annual physical exam   Primary Care at [facility], Kandee Keen, MD   2 years ago Mild intermittent asthma, unspecified whether complicated   Primary Care at [facility], Kandee Keen, MD       Future Appointments             In 1 week Georganna Skeans, MD Primary Care at [facility]

## 2022-01-13 ENCOUNTER — Ambulatory Visit (INDEPENDENT_AMBULATORY_CARE_PROVIDER_SITE_OTHER): Payer: BC Managed Care – PPO | Admitting: Family Medicine

## 2022-01-13 ENCOUNTER — Encounter: Payer: Self-pay | Admitting: Family Medicine

## 2022-01-13 VITALS — BP 132/85 | HR 61 | Temp 97.7°F | Resp 16 | Ht 70.0 in | Wt 218.2 lb

## 2022-01-13 DIAGNOSIS — Z0000 Encounter for general adult medical examination without abnormal findings: Secondary | ICD-10-CM

## 2022-01-13 DIAGNOSIS — Z125 Encounter for screening for malignant neoplasm of prostate: Secondary | ICD-10-CM

## 2022-01-13 DIAGNOSIS — Z1329 Encounter for screening for other suspected endocrine disorder: Secondary | ICD-10-CM | POA: Diagnosis not present

## 2022-01-13 DIAGNOSIS — Z130 Encounter for screening for diseases of the blood and blood-forming organs and certain disorders involving the immune mechanism: Secondary | ICD-10-CM

## 2022-01-13 DIAGNOSIS — Z13228 Encounter for screening for other metabolic disorders: Secondary | ICD-10-CM

## 2022-01-13 DIAGNOSIS — Z13220 Encounter for screening for lipoid disorders: Secondary | ICD-10-CM

## 2022-01-13 DIAGNOSIS — Z1211 Encounter for screening for malignant neoplasm of colon: Secondary | ICD-10-CM | POA: Diagnosis not present

## 2022-01-13 DIAGNOSIS — Z122 Encounter for screening for malignant neoplasm of respiratory organs: Secondary | ICD-10-CM

## 2022-01-13 DIAGNOSIS — L2082 Flexural eczema: Secondary | ICD-10-CM | POA: Insufficient documentation

## 2022-01-13 MED ORDER — ALBUTEROL SULFATE HFA 108 (90 BASE) MCG/ACT IN AERS: 2.0000 | INHALATION_SPRAY | Freq: Four times a day (QID) | RESPIRATORY_TRACT | 0 refills | Status: AC | PRN

## 2022-01-13 MED ORDER — ROSUVASTATIN CALCIUM 10 MG PO TABS: 10.0000 mg | ORAL_TABLET | Freq: Every day | ORAL | 1 refills | Status: AC

## 2022-01-13 MED ORDER — CLOBETASOL PROPIONATE 0.05 % EX CREA: 1.0000 | TOPICAL_CREAM | Freq: Two times a day (BID) | CUTANEOUS | 1 refills | Status: AC

## 2022-01-13 NOTE — Progress Notes (Unsigned)
New Patient Office Visit  Subjective    Patient ID: Edwin Murray, male    DOB: 02-Apr-1955  Age: 66 y.o. MRN: [id number]  CC:  Chief Complaint  Patient presents with   Annual Exam    HPI [name] presents to establish care ***  Outpatient Encounter Medications as of 01/13/2022  Medication Sig   albuterol (VENTOLIN HFA) 108 (90 Base) MCG/ACT inhaler Inhale 1-2 puffs into the lungs every 6 (six) hours as needed.   aspirin 81 MG chewable tablet Chew 81 mg by mouth daily.   clobetasol cream (TEMOVATE) 0.05 % Apply 1 application topically as needed (eczema).   fluticasone (FLONASE) 50 MCG/ACT nasal spray Place 2 sprays into both nostrils daily.   folic acid (FOLVITE) 1 MG tablet Take 1 mg by mouth daily.   methotrexate (RHEUMATREX) 2.5 MG tablet 8 tablets Orally once weekly on the same day for 30 days   Methotrexate 2.5 MG/ML SOLN TAKE 8 TABLETS BY MOUTH ONCE WEEKLY ON THE SAME DAY   omeprazole (PRILOSEC OTC) 20 MG tablet Take 20 mg by mouth daily.   rosuvastatin (CRESTOR) 10 MG tablet TAKE 1 TABLET BY MOUTH EVERY DAY   No facility-administered encounter medications on file as of 01/13/2022.    Past Medical History:  Diagnosis Date   Allergy 1967   Arthritis    Asthma 1990   GERD (gastroesophageal reflux disease)    Hyperlipidemia     Past Surgical History:  Procedure Laterality Date   KNEE SURGERY Left    x2   KNEE SURGERY Right    x2    Family History  Problem Relation Age of Onset   Diabetes Mother    Early death Mother    Arthritis Father    Meniere's disease Sister    Asthma Sister    High Cholesterol Sister    High Cholesterol Sister     Social History   Socioeconomic History   Marital status: Married    Spouse name: Not on file   Number of children: Not on file   Years of education: Not on file   Highest education level: Not on file  Occupational History   Not on file  Tobacco Use   Smoking status: Former    Packs/day: 1.00     Years: 25.00    Total pack years: 25.00    Types: Cigarettes    Quit date: 12/11/1996    Years since quitting: 25.1   Smokeless tobacco: Never  Substance and Sexual Activity   Alcohol use: Not Currently    Alcohol/week: 1.0 standard drink of alcohol    Types: 1 Standard drinks or equivalent per week   Drug use: Never   Sexual activity: Yes    Partners: Female    Birth control/protection: Post-menopausal  Other Topics Concern   Not on file  Social History Narrative   Not on file   Social Determinants of Health   Financial Resource Strain: Not on file  Food Insecurity: Not on file  Transportation Needs: Not on file  Physical Activity: Not on file  Stress: Not on file  Social Connections: Not on file  Intimate Partner Violence: Not on file    ROS      Objective    BP 132/85   Pulse 61   Temp 97.7 F (36.5 C) (Oral)   Resp 16   Ht 5\' 10"  (1.778 m)   Wt 218 lb 3.2 oz (99 kg)   SpO2 97%   BMI  31.31 kg/m   Physical Exam  {Labs (Optional):23779}    Assessment & Plan:   1. Annual physical exam   2. Screening for colon cancer   3. Screening for deficiency anemia   4. Screening for lipid disorders   5. Screening for endocrine/metabolic/immunity disorders   6. Screening for prostate cancer  7. Screening for lung cancer *** - CT CHEST LUNG CA SCREEN LOW DOSE W/O CM; Future    No follow-ups on file.   Edwin Raymond, MD

## 2022-01-13 NOTE — Progress Notes (Unsigned)
Patient is here for complete physical examination.  Patient has no other concerns at the moment.

## 2022-01-14 ENCOUNTER — Encounter: Payer: Self-pay | Admitting: Family Medicine

## 2022-01-14 LAB — CBC WITH DIFFERENTIAL/PLATELET
Basophils Absolute: 0.1 10*3/uL (ref 0.0–0.2)
Basos: 1 %
EOS (ABSOLUTE): 0.2 10*3/uL (ref 0.0–0.4)
Eos: 5 %
Hematocrit: 42.3 % (ref 37.5–51.0)
Hemoglobin: 14.1 g/dL (ref 13.0–17.7)
Immature Grans (Abs): 0 10*3/uL (ref 0.0–0.1)
Immature Granulocytes: 1 %
Lymphocytes Absolute: 0.5 10*3/uL — ABNORMAL LOW (ref 0.7–3.1)
Lymphs: 12 %
MCH: 31.7 pg (ref 26.6–33.0)
MCHC: 33.3 g/dL (ref 31.5–35.7)
MCV: 95 fL (ref 79–97)
Monocytes Absolute: 0.5 10*3/uL (ref 0.1–0.9)
Monocytes: 11 %
Neutrophils Absolute: 2.8 10*3/uL (ref 1.4–7.0)
Neutrophils: 70 %
Platelets: 163 10*3/uL (ref 150–450)
RBC: 4.45 x10E6/uL (ref 4.14–5.80)
RDW: 14.9 % (ref 11.6–15.4)
WBC: 4.1 10*3/uL (ref 3.4–10.8)

## 2022-01-14 LAB — CMP14+EGFR
ALT: 30 IU/L (ref 0–44)
AST: 30 IU/L (ref 0–40)
Albumin/Globulin Ratio: 2.4 — ABNORMAL HIGH (ref 1.2–2.2)
Albumin: 4.7 g/dL (ref 3.9–4.9)
Alkaline Phosphatase: 75 IU/L (ref 44–121)
BUN/Creatinine Ratio: 12 (ref 10–24)
BUN: 13 mg/dL (ref 8–27)
Bilirubin Total: 0.4 mg/dL (ref 0.0–1.2)
CO2: 17 mmol/L — ABNORMAL LOW (ref 20–29)
Calcium: 9.4 mg/dL (ref 8.6–10.2)
Chloride: 108 mmol/L — ABNORMAL HIGH (ref 96–106)
Creatinine, Ser: 1.06 mg/dL (ref 0.76–1.27)
Globulin, Total: 2 g/dL (ref 1.5–4.5)
Glucose: 100 mg/dL — ABNORMAL HIGH (ref 70–99)
Potassium: 4.3 mmol/L (ref 3.5–5.2)
Sodium: 144 mmol/L (ref 134–144)
Total Protein: 6.7 g/dL (ref 6.0–8.5)
eGFR: 78 mL/min/{1.73_m2} (ref >59)

## 2022-01-14 LAB — LIPID PANEL
Chol/HDL Ratio: 3.2 ratio (ref 0.0–5.0)
Cholesterol, Total: 191 mg/dL (ref 100–199)
HDL: 59 mg/dL (ref >39)
LDL Chol Calc (NIH): 119 mg/dL — ABNORMAL HIGH (ref 0–99)
Triglycerides: 71 mg/dL (ref 0–149)
VLDL Cholesterol Cal: 13 mg/dL (ref 5–40)

## 2022-01-14 LAB — TSH: TSH: 2.91 u[IU]/mL (ref 0.450–4.500)

## 2022-01-14 LAB — PSA, TOTAL AND FREE
PSA, Free Pct: 64 %
PSA, Free: 0.32 ng/mL
Prostate Specific Ag, Serum: 0.5 ng/mL (ref 0.0–4.0)

## 2022-01-14 LAB — HEMOGLOBIN A1C
Est. average glucose Bld gHb Est-mCnc: 108 mg/dL
Hgb A1c MFr Bld: 5.4 % (ref 4.8–5.6)

## 2022-03-09 DIAGNOSIS — Z1211 Encounter for screening for malignant neoplasm of colon: Secondary | ICD-10-CM | POA: Diagnosis not present

## 2022-03-16 LAB — COLOGUARD: COLOGUARD: NEGATIVE

## 2022-07-14 ENCOUNTER — Ambulatory Visit: Payer: BC Managed Care – PPO | Admitting: Family Medicine

## 2022-11-11 IMAGING — DX DG KNEE COMPLETE 4+V*L*
4 series · 4 of 4 positions shown · non-contrast
Comparison: None.

CLINICAL DATA: Chronic pain

EXAM:
LEFT KNEE - COMPLETE 4+ VIEW

[knee standing ap]
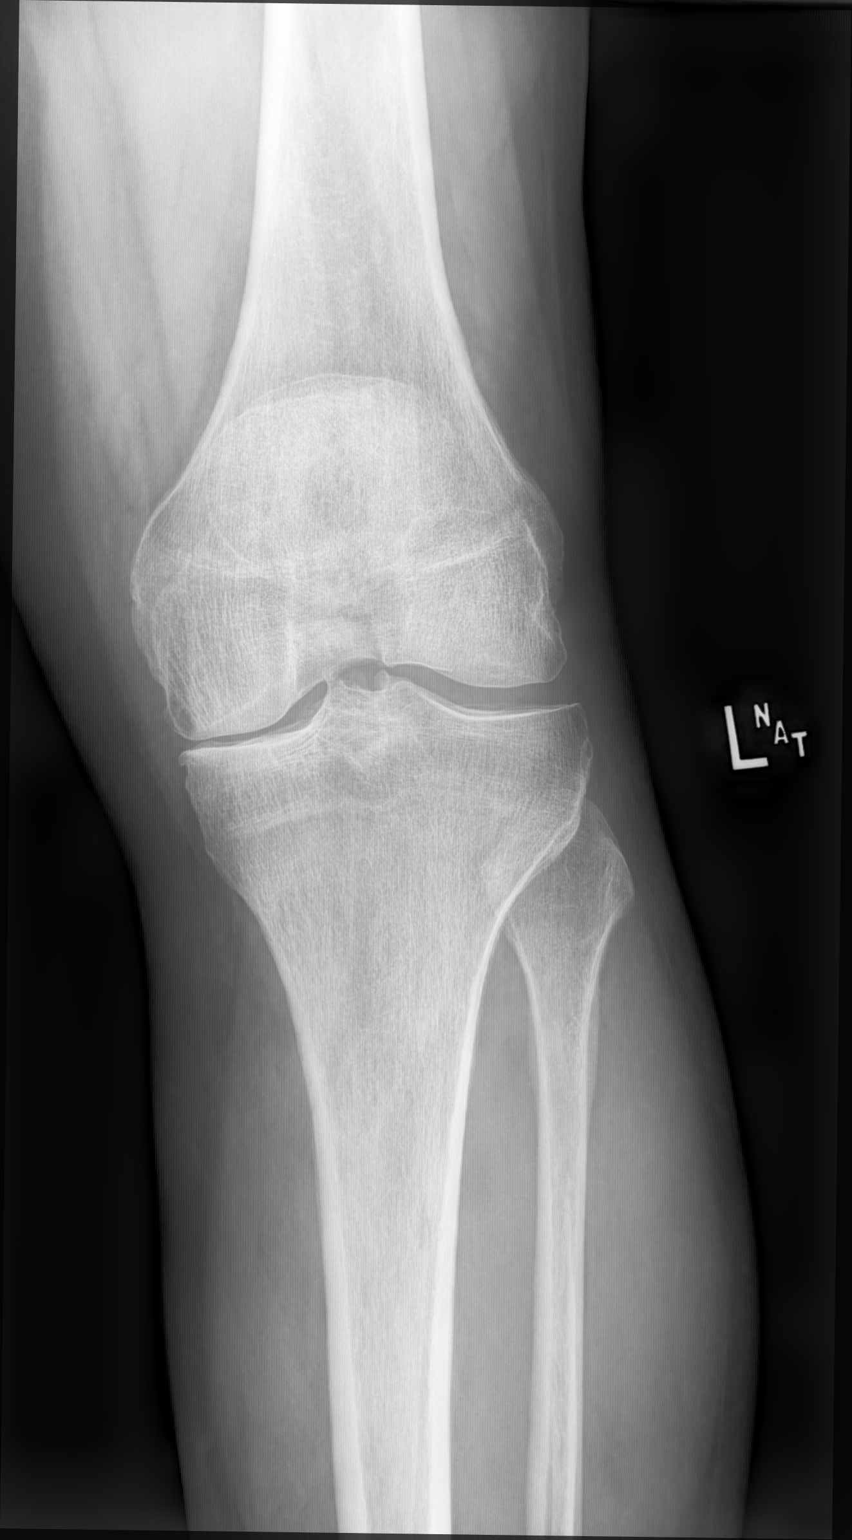

[knee lmo]
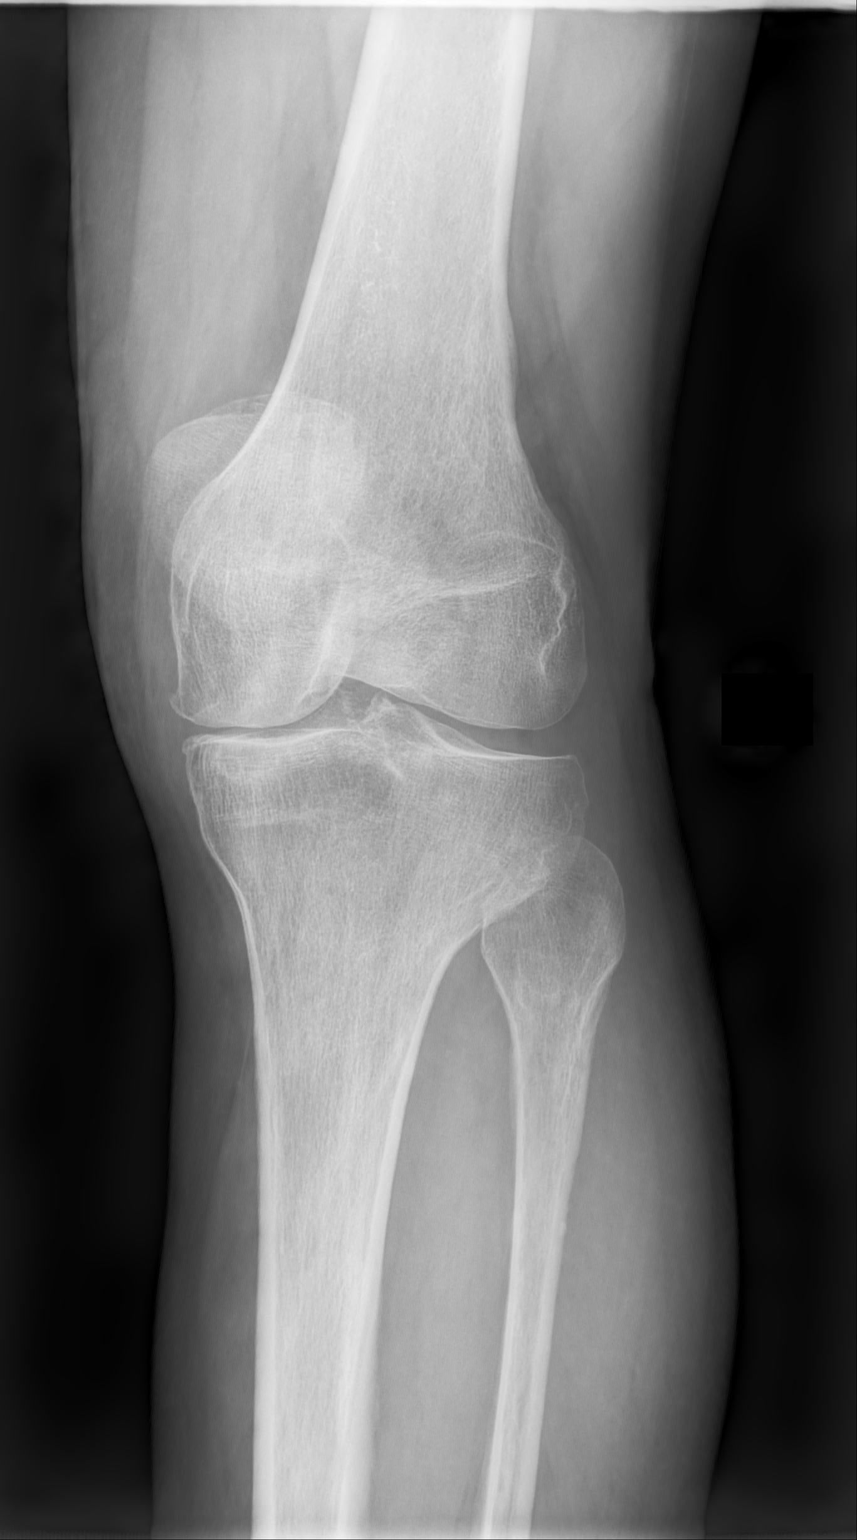

[knee mlo]
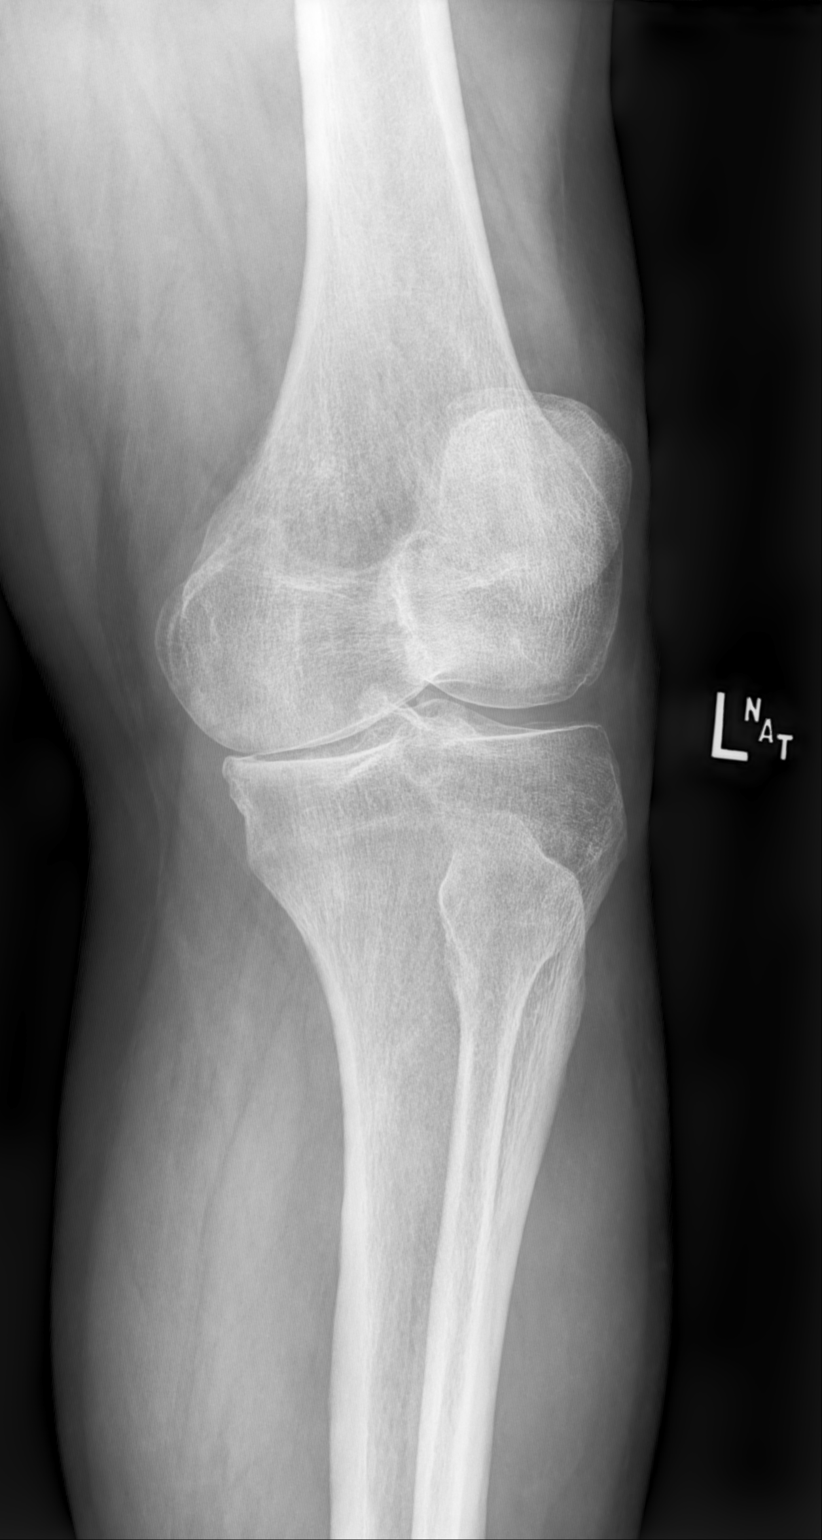

[knee standing lat]
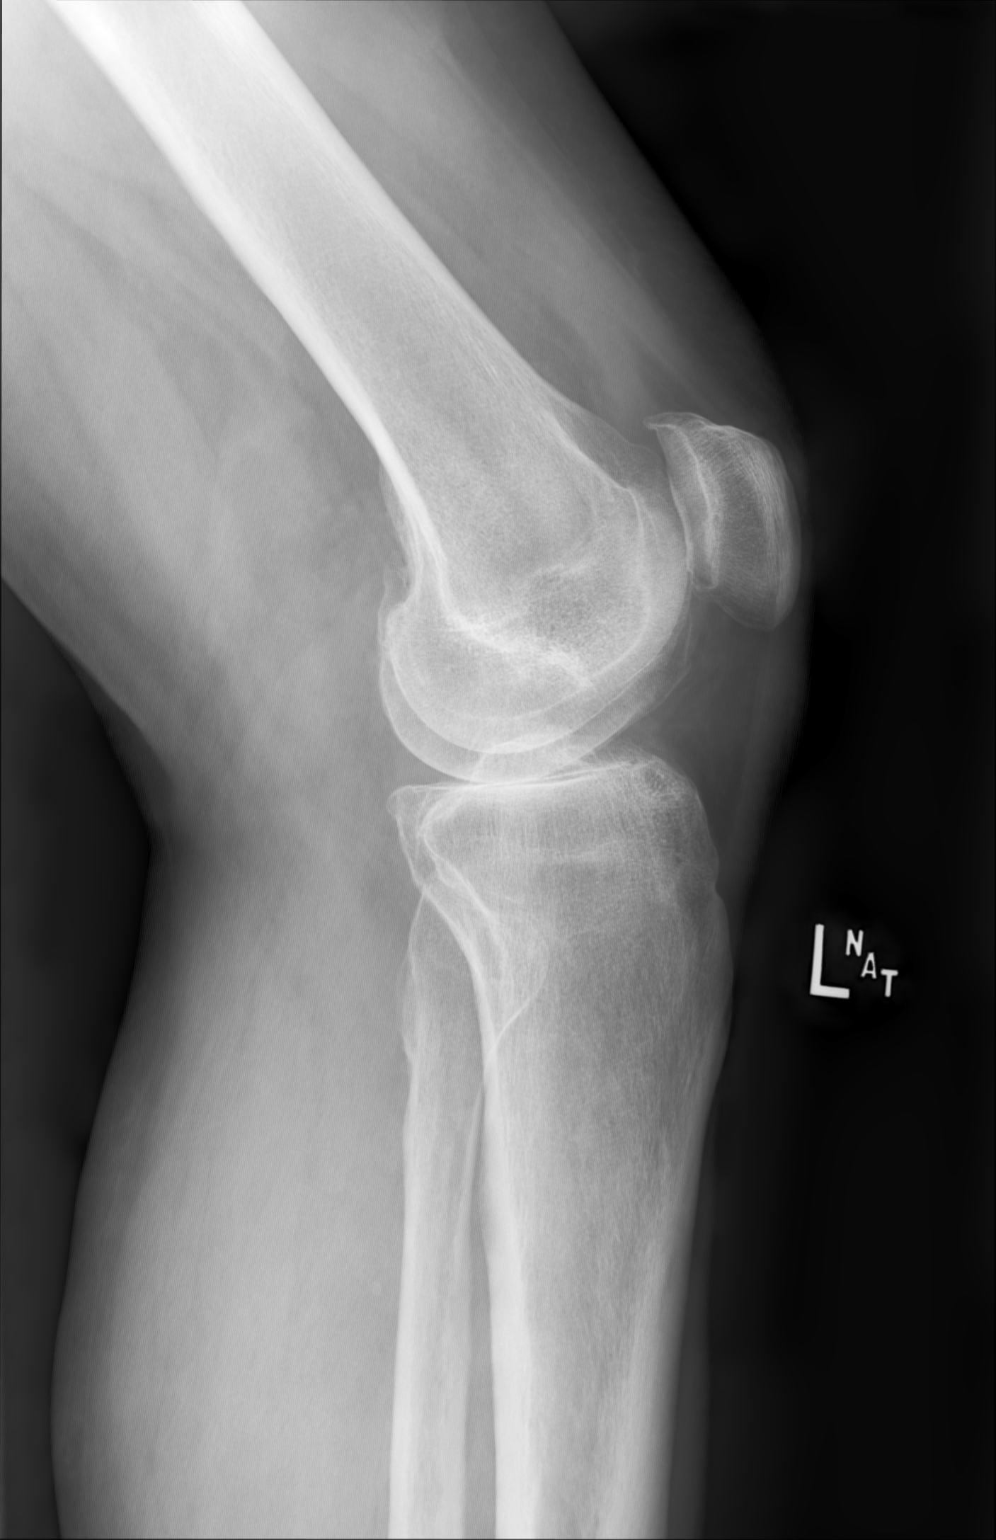

[4 of 4 positions shown; findings below may reference images not displayed]

FINDINGS: Frontal, lateral, and bilateral oblique views were obtained. No
fracture or dislocation. There is a small joint effusion. There is
moderately severe joint space narrowing medially with milder
narrowing in the patellofemoral joint region. There is spurring
medially and arising from the posterior patella. No erosive changes.
IMPRESSION: Osteoarthritic change, most severe medially but also involving the
patellofemoral joint. No fracture or dislocation. There is a small
knee joint effusion.

## 2023-11-12 ENCOUNTER — Encounter: Payer: Self-pay | Admitting: Neurology

## 2023-12-20 ENCOUNTER — Other Ambulatory Visit: Payer: Self-pay | Admitting: Physician Assistant

## 2023-12-20 DIAGNOSIS — R519 Headache, unspecified: Secondary | ICD-10-CM

## 2024-01-24 ENCOUNTER — Other Ambulatory Visit: Payer: Self-pay

## 2024-02-02 ENCOUNTER — Telehealth: Payer: Self-pay | Admitting: Neurology

## 2024-02-02 NOTE — Telephone Encounter (Signed)
 Pt called to cancel appt  due to conflict  Appt Canceled

## 2024-02-03 NOTE — Progress Notes (Unsigned)
 NEUROLOGY CONSULTATION NOTE  Edwin Murray MRN: [id number] DOB: 1955/04/17  Referring provider: [employer], MD Primary care provider: Raguel Blush, MD  Reason for consult:  headache  Assessment/Plan:   ***   Subjective:  Edwin Murray is a 68 year old ***-handed male with rheumatoid arthritis, asthma, HLD and GERD who presents for headache.  History supplemented by referring provider's note.  Onset:  *** Location:  *** Quality:  *** Intensity:  ***.  Aura:  *** Prodrome:  *** Postdrome:  *** Associated symptoms:  ***.  *** denies associated unilateral numbness or weakness. Duration:  *** Frequency:  *** Frequency of abortive medication: *** Triggers:  *** Relieving factors:  *** Activity:  ***  Past NSAIDS/analgesics:  *** Past abortive triptans:  *** Past abortive ergotamine:  *** Past muscle relaxants:  *** Past anti-emetic:  *** Past antihypertensive medications:  *** Past antidepressant medications:  *** Past anticonvulsant medications:  *** Past anti-CGRP:  *** Past vitamins/Herbal/Supplements:  *** Past antihistamines/decongestants:  *** Other past therapies:  ***  Current NSAIDS/analgesics:  ASA 81mg  daily Current triptans:  none Current ergotamine:  none Current anti-emetic:  none Current muscle relaxants:  methocarbamol 750mg  Q8h PRN Current Antihypertensive medications:  none Current Antidepressant medications:  none Current Anticonvulsant medications:  none Current anti-CGRP:  none Current Vitamins/Herbal/Supplements:  MVI Current Antihistamines/Decongestants:  Flonase  Other therapy:  *** Other medications:  Prilosec, rosuvastatin , [place]    Caffeine:  *** Alcohol:  *** Smoker:  *** Diet:  *** Exercise:  *** Depression:  ***; Anxiety:  *** Other pain:  *** Sleep hygiene:  ***  History of TBI/concussion:  *** Family history of headache:  *** Family history of cerebral aneurysm:  ***      PAST MEDICAL HISTORY: Past  Medical History:  Diagnosis Date   Allergy 1967   Arthritis    Asthma 1990   GERD (gastroesophageal reflux disease)    Hyperlipidemia     PAST SURGICAL HISTORY: Past Surgical History:  Procedure Laterality Date   KNEE SURGERY Left    x2   KNEE SURGERY Right    x2    MEDICATIONS: Medications Ordered Prior to Encounter[1]  ALLERGIES: Allergies[2]  FAMILY HISTORY: Family History  Problem Relation Age of Onset   Diabetes Mother    Early death Mother    Arthritis Father    Meniere's disease Sister    Asthma Sister    High Cholesterol Sister    High Cholesterol Sister     Objective:  *** General: No acute distress.  Patient appears well-groomed.   Head:  Normocephalic/atraumatic Eyes:  fundi examined but not visualized Neck: supple, no paraspinal tenderness, full range of motion Heart: regular rate and rhythm Neurological Exam: Mental status: alert and oriented to person, place, and time, speech fluent and not dysarthric, language intact. Cranial nerves: CN I: not tested CN II: pupils equal, round and reactive to light, visual fields intact CN III, IV, VI:  full range of motion, no nystagmus, no ptosis CN V: facial sensation intact. CN VII: upper and lower face symmetric CN VIII: hearing intact CN IX, X: gag intact, uvula midline CN XI: sternocleidomastoid and trapezius muscles intact CN XII: tongue midline Bulk & Tone: normal, no fasciculations. Motor:  muscle strength 5/5 throughout Sensation:  Pinprick and vibratory sensation intact. Deep Tendon Reflexes:  2+ throughout,  toes downgoing.   Finger to nose testing:  Without dysmetria.   Gait:  Normal station and stride.  Romberg negative.    Thank you  for allowing me to take part in the care of this patient.  Juliene Dunnings, DO  CC: ***        [1]  Current Outpatient Medications on File Prior to Visit  Medication Sig Dispense Refill   albuterol  (VENTOLIN  HFA) 108 (90 Base) MCG/ACT inhaler Inhale  1-2 puffs into the lungs every 6 (six) hours as needed. 18 g 1   albuterol  (VENTOLIN  HFA) 108 (90 Base) MCG/ACT inhaler Inhale 2 puffs into the lungs every 6 (six) hours as needed for wheezing or shortness of breath. 8 g 0   aspirin 81 MG chewable tablet Chew 81 mg by mouth daily.     clobetasol  cream (TEMOVATE ) 0.05 % Apply 1 application topically as needed (eczema). 30 g 1   clobetasol  cream (TEMOVATE ) 0.05 % Apply 1 Application topically 2 (two) times daily. 30 g 1   fluticasone  (FLONASE ) 50 MCG/ACT nasal spray Place 2 sprays into both nostrils daily. 16 g 6   folic acid (FOLVITE) 1 MG tablet Take 1 mg by mouth daily.     methotrexate  (RHEUMATREX) 2.5 MG tablet 8 tablets Orally once weekly on the same day for 30 days     Methotrexate  2.5 MG/ML SOLN TAKE 8 TABLETS BY MOUTH ONCE WEEKLY ON THE SAME DAY     omeprazole (PRILOSEC OTC) 20 MG tablet Take 20 mg by mouth daily.     rosuvastatin  (CRESTOR ) 10 MG tablet Take 1 tablet (10 mg total) by mouth daily. 90 tablet 1   No current facility-administered medications on file prior to visit.  [2]  Allergies Allergen Reactions   Other     Eggplant

## 2024-02-04 ENCOUNTER — Encounter: Payer: Self-pay | Admitting: Neurology

## 2024-02-04 ENCOUNTER — Ambulatory Visit: Admitting: Neurology

## 2024-02-04 VITALS — BP 143/81 | HR 76 | Resp 18 | Ht 69.0 in | Wt 212.0 lb

## 2024-02-04 DIAGNOSIS — R55 Syncope and collapse: Secondary | ICD-10-CM

## 2024-02-04 DIAGNOSIS — R519 Headache, unspecified: Secondary | ICD-10-CM

## 2024-02-04 NOTE — Patient Instructions (Addendum)
 Check MRI of brain with and without contrast and MRA of head Try taking methocarbamol with Excedrin as needed.  If ineffective, contact me and we can try something else Limit use of pain relievers to no more than 9 days out of the month to prevent risk of rebound or medication-overuse headache. Keep headache diary Follow up 4 to 5 months.

## 2024-02-14 ENCOUNTER — Ambulatory Visit: Admitting: Neurology

## 2024-03-05 ENCOUNTER — Ambulatory Visit
Admission: RE | Admit: 2024-03-05 | Discharge: 2024-03-05 | Disposition: A | Source: Ambulatory Visit | Attending: Neurology

## 2024-03-05 DIAGNOSIS — R519 Headache, unspecified: Secondary | ICD-10-CM

## 2024-03-05 DIAGNOSIS — R55 Syncope and collapse: Secondary | ICD-10-CM

## 2024-03-05 MED ORDER — GADOPICLENOL 0.5 MMOL/ML IV SOLN
9.5000 mL | Freq: Once | INTRAVENOUS | Status: AC | PRN
  Administered 2024-03-05: 9.5 mL via INTRAVENOUS

## 2024-03-07 ENCOUNTER — Ambulatory Visit: Payer: Self-pay | Admitting: Neurology

## 2024-03-09 ENCOUNTER — Ambulatory Visit: Payer: Self-pay | Admitting: Neurology

## 2024-03-10 ENCOUNTER — Other Ambulatory Visit: Payer: Self-pay

## 2024-03-10 DIAGNOSIS — R55 Syncope and collapse: Secondary | ICD-10-CM

## 2024-03-10 DIAGNOSIS — R519 Headache, unspecified: Secondary | ICD-10-CM

## 2024-03-10 MED ORDER — BACLOFEN 10 MG PO TABS: ORAL_TABLET | ORAL | 5 refills | Status: AC

## 2024-03-11 ENCOUNTER — Other Ambulatory Visit (HOSPITAL_COMMUNITY): Payer: Self-pay

## 2024-03-11 MED ORDER — AMOXICILLIN 500 MG PO CAPS
ORAL_CAPSULE | ORAL | 2 refills | Status: AC
  Filled 2024-03-11: qty 16, 2d supply, fill #0
  Filled 2024-03-13: qty 16, 4d supply, fill #0

## 2024-03-13 ENCOUNTER — Other Ambulatory Visit (HOSPITAL_COMMUNITY): Payer: Self-pay

## 2024-07-04 ENCOUNTER — Ambulatory Visit: Payer: Self-pay | Admitting: Neurology
